# Patient Record
Sex: Male | Born: 1990 | Hispanic: No | Marital: Single | Smoking: Current every day smoker
Health system: Southern US, Community
[De-identification: ages and names within clinical notes are randomized; demographics above are authoritative.]

## PROBLEM LIST (undated history)

## (undated) ENCOUNTER — Ambulatory Visit

## (undated) HISTORY — PX: TONSILLECTOMY: SUR1361

---
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
## 2011-05-09 ENCOUNTER — Encounter (HOSPITAL_COMMUNITY): Payer: Self-pay | Admitting: *Deleted

## 2011-05-09 ENCOUNTER — Emergency Department (HOSPITAL_COMMUNITY)
Admission: EM | Admit: 2011-05-09 | Discharge: 2011-05-09 | Disposition: A | Discharge status: Home / Self Care | Payer: BC Managed Care – PPO | Source: Home / Self Care | Attending: Family Medicine | Admitting: Family Medicine

## 2011-05-09 DIAGNOSIS — Z23 Encounter for immunization: Secondary | ICD-10-CM

## 2011-05-09 DIAGNOSIS — S91309A Unspecified open wound, unspecified foot, initial encounter: Secondary | ICD-10-CM

## 2011-05-09 DIAGNOSIS — S91331A Puncture wound without foreign body, right foot, initial encounter: Secondary | ICD-10-CM

## 2011-05-09 MED ORDER — TETANUS-DIPHTH-ACELL PERTUSSIS 5-2.5-18.5 LF-MCG/0.5 IM SUSP
0.5000 mL | Freq: Once | INTRAMUSCULAR | Status: AC
  Administered 2011-05-09: 0.5 mL via INTRAMUSCULAR

## 2011-05-09 MED ORDER — TETANUS-DIPHTH-ACELL PERTUSSIS 5-2.5-18.5 LF-MCG/0.5 IM SUSP
INTRAMUSCULAR | Status: AC
  Filled 2011-05-09: qty 0.5

## 2011-05-09 NOTE — ED Provider Notes (Signed)
Medical screening examination/treatment/procedure(s) were performed by a resident physician and as supervising physician I was immediately available for consultation/collaboration.  Leslee Home, M.D.   Reuben Likes, MD 05/09/11 2141

## 2011-05-09 NOTE — Discharge Instructions (Signed)
Thank you for coming in today. I think you will be fine.  If your foot becomes red and very painful or starts to ooze pus please come right back or go to your doctor.  This can get infected with some nasty bacteria however they often are fine.

## 2011-05-09 NOTE — ED Provider Notes (Signed)
Casey Gallagher is a 21 y.o. male who presents to Urgent Care today for puncture wound on the heel of the right foot.  At 10:30 this morning the patient was walking in his backyard when he stepped on a rusty nail.  The nail went through his shoe and into the heel of his right foot.  He immediately stepped off the nail removed issue.  He then cleaned his foot and went to urgent care.  He feels well and doesn't have any significant pain of his right foot.  He thinks his last tetanus shot was 5 or 10 years ago but is not sure.   PMH reviewed. Otherwise healthy young man ROS as above otherwise neg.  no chest pains, palpitations, fevers, chills, abdominal pain nausea or vomiting. Medications reviewed. Current Facility-Administered Medications  Medication Dose Route Frequency Provider Last Rate Last Dose  . TDaP (BOOSTRIX) injection 0.5 mL  0.5 mL Intramuscular Once Adlih Moreno-Coll, MD   0.5 mL at 05/09/11 1401   Current Outpatient Prescriptions  Medication Sig Dispense Refill  . ALBUTEROL IN Inhale into the lungs as needed.      . tiotropium (SPIRIVA) 18 MCG inhalation capsule Place 18 mcg into inhaler and inhale daily.      . TRAMADOL HCL PO Take by mouth as needed.        Exam:  BP 110/68  Pulse 54  Temp(Src) 97.6 F (36.4 C) (Oral)  Resp 16  SpO2 100% Gen: Well NAD RIGHT FOOT: Very small puncture wound on the heel of the right foot.  No surrounding erythema or bleeding.  No apparent foreign body.  Wound is nontender to palpation.  No results found for this or any previous visit (from the past 24 hour(s)). No results found.  Assessment and Plan: 21 y.o. male with puncture wound on the right foot. The wound is very superficial and noninfected.  The wound was cleaned well in clinic and he was administered a tetanus shot.  I don't think the risk for infection is high enough to empirically start prophylactic antibiotics.  I discussed the plan with the patient. Plan to do watchful waiting and  followup with her primary care doctor in a week.  Discussed signs or symptoms of infection such as redness pain and pus. If he experiences those symptoms he will go right to the doctor's office or the emergency room.  He expresses understanding.    Rodolph Bong, MD 05/09/11 1410

## 2011-05-09 NOTE — ED Notes (Signed)
Pt soaking rt foot in soln of warm water with CHG detergent.

## 2011-05-09 NOTE — ED Notes (Addendum)
Reports stepping on nail early this AM; nail punctured shoe sole and right heel.  Heel soaking.  Unsure date of last Tdap.

## 2012-08-20 ENCOUNTER — Emergency Department (HOSPITAL_COMMUNITY)
Admission: EM | Admit: 2012-08-20 | Discharge: 2012-08-20 | Disposition: A | Discharge status: Home / Self Care | Payer: BC Managed Care – PPO | Source: Home / Self Care | Attending: Family Medicine | Admitting: Family Medicine

## 2012-08-20 ENCOUNTER — Encounter (HOSPITAL_COMMUNITY): Payer: Self-pay | Admitting: *Deleted

## 2012-08-20 ENCOUNTER — Emergency Department (INDEPENDENT_AMBULATORY_CARE_PROVIDER_SITE_OTHER): Payer: BC Managed Care – PPO

## 2012-08-20 DIAGNOSIS — S90129A Contusion of unspecified lesser toe(s) without damage to nail, initial encounter: Secondary | ICD-10-CM

## 2012-08-20 DIAGNOSIS — S99929A Unspecified injury of unspecified foot, initial encounter: Secondary | ICD-10-CM

## 2012-08-20 DIAGNOSIS — S99919A Unspecified injury of unspecified ankle, initial encounter: Secondary | ICD-10-CM

## 2012-08-20 DIAGNOSIS — S90121A Contusion of right lesser toe(s) without damage to nail, initial encounter: Secondary | ICD-10-CM

## 2012-08-20 DIAGNOSIS — S99921A Unspecified injury of right foot, initial encounter: Secondary | ICD-10-CM

## 2012-08-20 NOTE — ED Notes (Signed)
Reports playing around with sibling yesterday afternoon when he stepped onto right great toe while bent.  C/O continued pain.  Ecchymosis noted.

## 2012-08-20 NOTE — ED Provider Notes (Signed)
CSN: [id number]     Arrival date & time 08/20/12  1122 History     First MD Initiated Contact with Patient 08/20/12 1135     Chief Complaint  Patient presents with  . Toe Injury   (Consider location/radiation/quality/duration/timing/severity/associated sxs/prior Treatment) HPI Comments: 22 year old male presents for evaluation of injury to his right toe. He was "roughhousing" with his brother when his brother landed on his toe and hurt it. Now, he has swelling and bruising in the right great toe and has difficulty moving the joint. The swelling and pain have increased from yesterday to today. The top of the toe is very tender to touch as well. He has a history of breaking a finger with minimal pain so he just wanted to be careful. Denies any numbness distal to the injury or history of frequent broken bones. No history of previous toe injury   Past Medical History  Diagnosis Date  . Emphysema   . Asthma    Past Surgical History  Procedure Laterality Date  . Tonsillectomy     No family history on file. History  Substance Use Topics  . Smoking status: Current Every Day Smoker -- 0.50 packs/day for 13 years    Types: Cigarettes  . Smokeless tobacco: Not on file  . Alcohol Use: No    Review of Systems  Constitutional: Negative for fever, chills and fatigue.  Eyes: Negative for visual disturbance.  Respiratory: Negative for cough and shortness of breath.   Cardiovascular: Negative for chest pain, palpitations and leg swelling.  Musculoskeletal: Positive for joint swelling (right great toe) and arthralgias. Negative for myalgias.  Skin: Negative for rash and wound.  Neurological: Negative for dizziness, weakness and light-headedness.    Allergies  Review of patient's allergies indicates no known allergies.  Home Medications   Current Outpatient Rx  Name  Route  Sig  Dispense  Refill  . ALBUTEROL IN   Inhalation   Inhale into the lungs as needed.         . tiotropium  (SPIRIVA) 18 MCG inhalation capsule   Inhalation   Place 18 mcg into inhaler and inhale daily.         . TRAMADOL HCL PO   Oral   Take by mouth as needed.          BP 122/80  Pulse 71  Temp(Src) 98.4 F (36.9 C) (Oral)  Resp 16  SpO2 100% Physical Exam  Nursing note and vitals reviewed. Constitutional: He is oriented to person, place, and time. He appears well-developed and well-nourished. No distress.  Musculoskeletal:       Feet:  Neurological: He is alert and oriented to person, place, and time. Coordination normal.  Skin: Skin is warm and dry. No rash noted. He is not diaphoretic.  Psychiatric: He has a normal mood and affect.    ED Course   Procedures (including critical care time)  Labs Reviewed - No data to display Dg Toe Great Right  08/20/2012   *RADIOLOGY REPORT*  Clinical Data: Pain post trauma.  RIGHT GREAT TOE  Comparison: None.  Findings: No radiodense foreign body or subcutaneous gas. Negative for fracture, dislocation, or other acute abnormality.  Normal alignment and mineralization. No significant degenerative change. Regional soft tissues unremarkable.  IMPRESSION:  Negative   Original Report Authenticated By: D. Andria Rhein, MD   1. Toe injury, right, initial encounter   2. Contusion, toe, right, initial encounter     MDM  No radiographic evidence of  fracture. RICE, PRN ibuprofen or tylenol, followup if worsening.  Graylon Good, PA-C 08/20/12 1235

## 2012-08-21 NOTE — ED Provider Notes (Signed)
Medical screening examination/treatment/procedure(s) were performed by non-physician practitioner and as supervising physician I was immediately available for consultation/collaboration.   [facility]; MD  Sharin Grave, MD 08/21/12 1006

## 2014-12-11 ENCOUNTER — Encounter (HOSPITAL_COMMUNITY): Payer: Self-pay | Admitting: Emergency Medicine

## 2014-12-11 ENCOUNTER — Emergency Department (HOSPITAL_COMMUNITY)
Admission: EM | Admit: 2014-12-11 | Discharge: 2014-12-11 | Disposition: A | Discharge status: Home / Self Care | Payer: 59 | Source: Home / Self Care | Attending: Emergency Medicine | Admitting: Emergency Medicine

## 2014-12-11 DIAGNOSIS — J329 Chronic sinusitis, unspecified: Secondary | ICD-10-CM

## 2014-12-11 MED ORDER — PREDNISONE 50 MG PO TABS: ORAL_TABLET | ORAL | Status: DC

## 2014-12-11 MED ORDER — AZITHROMYCIN 250 MG PO TABS: ORAL_TABLET | ORAL | Status: DC

## 2014-12-11 MED ORDER — IPRATROPIUM BROMIDE 0.06 % NA SOLN: 2.0000 | Freq: Four times a day (QID) | NASAL | Status: DC

## 2014-12-11 NOTE — ED Notes (Signed)
Pt here with URI sx's after son was sick last week C/o nasal congestion,headache. Post nasal drip, cough and dk brown phlegm Taking cough syrup OTC, not working  Sx's started Sunday with worsening yesterday  VSS, Hx Asthma, Emphysema

## 2014-12-11 NOTE — ED Provider Notes (Signed)
CSN: [id number]     Arrival date & time 12/11/14  1303 History   First MD Initiated Contact with Patient 12/11/14 1334     Chief Complaint  Patient presents with  . URI  . Sinus Problem   (Consider location/radiation/quality/duration/timing/severity/associated sxs/prior Treatment) HPI  He is a 24 year old man here for evaluation of nasal congestion and rhinorrhea. He states his son was sick with similar symptoms last week. On Sunday, he noted some mild nasal congestion, cough, and rhinorrhea. His symptoms worsened on Monday. He reports headaches, sinus pressure, right earache, nasal congestion, rhinorrhea, postnasal drainage, and cough. The cough is productive of dark brown sputum. He states he will feel a little short of breath at night. He states he has felt warm, but has not taken a temperature. He denies body aches. He reports decreased energy. He does have a history of asthma and emphysema. He has Spiriva and albuterol inhalers, but has not been using them the last few days due to moving.  Past Medical History  Diagnosis Date  . Emphysema   . Asthma    Past Surgical History  Procedure Laterality Date  . Tonsillectomy     No family history on file. Social History  Substance Use Topics  . Smoking status: Current Every Day Smoker -- 0.50 packs/day for 13 years    Types: Cigarettes  . Smokeless tobacco: None  . Alcohol Use: No    Review of Systems As in history of present illness Allergies  Review of patient's allergies indicates no known allergies.  Home Medications   Prior to Admission medications   Medication Sig Start Date End Date Taking? Authorizing Provider  ALBUTEROL IN Inhale into the lungs as needed.    Historical Provider, MD  azithromycin (ZITHROMAX Z-PAK) 250 MG tablet Take 2 pills today, then 1 pill daily until gone. 12/11/14   Charm Rings, MD  ipratropium (ATROVENT) 0.06 % nasal spray Place 2 sprays into both nostrils 4 (four) times daily. 12/11/14   Charm Rings, MD  predniSONE (DELTASONE) 50 MG tablet Take 1 pill daily for 5 days. 12/11/14   Charm Rings, MD  tiotropium (SPIRIVA) 18 MCG inhalation capsule Place 18 mcg into inhaler and inhale daily.    Historical Provider, MD  TRAMADOL HCL PO Take by mouth as needed.    Historical Provider, MD   Meds Ordered and Administered this Visit  Medications - No data to display  BP 110/62 mmHg  Pulse 79  Temp(Src) 98.6 F (37 C) (Oral)  SpO2 100% No data found.   Physical Exam  Constitutional: He is oriented to person, place, and time. He appears well-developed and well-nourished.  HENT:  Mouth/Throat: No oropharyngeal exudate.  TMs mildly retracted bilaterally. Nasal mucosa is erythematous and swollen. Oropharynx is erythematous, but without exudate. He does have significant postnasal drainage.  Eyes: Conjunctivae are normal.  Neck: Neck supple.  Cardiovascular: Normal rate, regular rhythm and normal heart sounds.   No murmur heard. Pulmonary/Chest: Effort normal and breath sounds normal. No respiratory distress. He has no wheezes. He has no rales.  Lymphadenopathy:    He has cervical adenopathy.  Neurological: He is alert and oriented to person, place, and time.    ED Course  Procedures (including critical care time)  Labs Review Labs Reviewed - No data to display  Imaging Review No results found.     MDM   1. Rhinosinusitis    Treat symptomatically with prednisone and Atrovent nasal spray. Discussed importance  of resuming his inhalers as soon as possible. Prescription given for azithromycin to be filled on Friday if not improving. Follow-up as needed.    Charm RingsErin J Honig, MD 12/11/14 [phone]

## 2014-12-11 NOTE — Discharge Instructions (Signed)
You have a sinus infection. Likely viral at this point. Please take prednisone daily for the next 5 days. Use the Atrovent nasal spray 4 times a day as needed for congestion and runny nose. If you are not getting better by Friday, fill the prescription for azithromycin. Continue your Spiriva and albuterol as prescribed. Follow-up as needed.

## 2015-01-08 ENCOUNTER — Encounter (HOSPITAL_COMMUNITY): Payer: Self-pay | Admitting: Emergency Medicine

## 2015-01-08 ENCOUNTER — Emergency Department (INDEPENDENT_AMBULATORY_CARE_PROVIDER_SITE_OTHER)
Admission: EM | Admit: 2015-01-08 | Discharge: 2015-01-08 | Disposition: A | Discharge status: Home / Self Care | Payer: 59 | Source: Home / Self Care | Attending: Emergency Medicine | Admitting: Emergency Medicine

## 2015-01-08 DIAGNOSIS — H9201 Otalgia, right ear: Secondary | ICD-10-CM | POA: Diagnosis not present

## 2015-01-08 DIAGNOSIS — H6691 Otitis media, unspecified, right ear: Secondary | ICD-10-CM

## 2015-01-08 MED ORDER — IBUPROFEN 800 MG PO TABS: 800.0000 mg | ORAL_TABLET | Freq: Three times a day (TID) | ORAL | Status: DC

## 2015-01-08 MED ORDER — AMOXICILLIN-POT CLAVULANATE 875-125 MG PO TABS: 1.0000 | ORAL_TABLET | Freq: Two times a day (BID) | ORAL | Status: DC

## 2015-01-08 NOTE — Discharge Instructions (Signed)
Follow-up with your primary care physician. Go to the ER for the signs and symptoms we discussed. Try looking for an over-the-counter pain ear drop called Auralgan or A/B otic. Ask the pharmacist if they have this available. Many people find this helpful for ear pain.  Continue nasal steroid, start some saline nasal irrigation as needed for congestion.

## 2015-01-08 NOTE — ED Notes (Signed)
Here with c/o right ear pain, sharp, shooting pain that started 3 dys ago S/o Sinus infection, no dizziness or headaches reported

## 2015-01-08 NOTE — ED Provider Notes (Signed)
HPI  SUBJECTIVE:  Casey Gallagher is a 24 y.o. male who presents with 4 days of sharp, shooting pain in his right ear, muffled hearing. He reports right-sided headache, sore throat, nasal congestion, postnasal drip. He tried earwax remover without much improvement. There are no other aggravating or alleviating factors. He denies sinus pain/pressure, facial rash. No nausea, vomiting, fevers. No other URI-like symptoms. he reports popping and clicking his jaw, which is normal. 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 He grinds his teeth at night. His ear pain is not associated with chewing, eating, yawning. He does have sick contacts with similar symptoms. No antipyretic in the past 4-6 hours. He was recently treated for sinusitis 2 weeks ago with azithromycin, nasal steroids, oral steroid. Past medical history of TMJ disorder, asthma/emphysema, low back pain. No history of diabetes, hypertension, allergies.    Past Medical History  Diagnosis Date  . Emphysema   . Asthma     Past Surgical History  Procedure Laterality Date  . Tonsillectomy      No family history on file.  Social History  Substance Use Topics  . Smoking status: Current Every Day Smoker -- 0.50 packs/day for 13 years    Types: Cigarettes  . Smokeless tobacco: None  . Alcohol Use: No    No current facility-administered medications for this encounter.  Current outpatient prescriptions:  .  ALBUTEROL IN, Inhale into the lungs as needed., Disp: , Rfl:  .  amoxicillin-clavulanate (AUGMENTIN) 875-125 MG tablet, Take 1 tablet by mouth 2 (two) times daily. X 10 days, Disp: 20 tablet, Rfl: 0 .  ibuprofen (ADVIL,MOTRIN) 800 MG tablet, Take 1 tablet (800 mg total) by mouth 3 (three) times daily., Disp: 30 tablet, Rfl: 0 .  ipratropium (ATROVENT) 0.06 % nasal spray, Place 2 sprays into both nostrils 4 (four) times daily., Disp: 15 mL, Rfl: 0 .  tiotropium (SPIRIVA) 18 MCG inhalation capsule, Place 18 mcg into inhaler and  inhale daily., Disp: , Rfl:  .  TRAMADOL HCL PO, Take by mouth as needed., Disp: , Rfl:   No Known Allergies   ROS  As noted in HPI.   Physical Exam  BP 118/56 mmHg  Pulse 54  Temp(Src) 97.7 F (36.5 C) (Oral)  SpO2 96%  Constitutional: Well developed, well nourished, no acute distress Eyes:  EOMI, conjunctiva normal bilaterally HENT: Normocephalic, atraumatic,mucus membranes moist. Right external ear within normal limits. Right external ear canal normal. Right TM injected, dull, but not bulging. No tenderness, crepitus at the bilateral TMJ. No trismus. Oropharynx normal. Hearing decreased right ear, but intact. Respiratory: Normal inspiratory effort Cardiovascular: Normal rate GI: nondistended skin: No rash, skin intact Musculoskeletal: no deformities Neurologic: Alert & oriented x 3, no focal neuro deficits Psychiatric: Speech and behavior appropriate   ED Course   Medications - No data to display  No orders of the defined types were placed in this encounter.    No results found for this or any previous visit (from the past 24 hour(s)). No results found.  ED Clinical Impression  Otalgia of right ear  Acute right otitis media, recurrence not specified, unspecified otitis media type   ED Assessment/Plan  Patient with right otitis media. Given that he just finished a course of azithromycin, will start him on Augmentin. Also 800 mg ibuprofen 3 times a day when necessary pain. Combined this with Tylenol. A/B otic or Auralgan over-the-counter for additional ear pain. Does not appear to be any TMJ involvement. Hearing is decreased,  but intact. Follow-up with primary care physician in several days, discussed signs and symptoms that should prompt return to the ER. Patient agrees with plan.   *This clinic note was created using Dragon dictation software. Therefore, there may be occasional mistakes despite careful proofreading.  ?   Domenick GongAshley Malaysha Arlen, MD 01/08/15  1743

## 2015-08-02 ENCOUNTER — Encounter (HOSPITAL_COMMUNITY): Payer: Self-pay | Admitting: *Deleted

## 2015-08-02 ENCOUNTER — Ambulatory Visit (HOSPITAL_COMMUNITY)
Admission: EM | Admit: 2015-08-02 | Discharge: 2015-08-02 | Disposition: A | Discharge status: Home / Self Care | Payer: Medicaid Other | Attending: Family Medicine | Admitting: Family Medicine

## 2015-08-02 DIAGNOSIS — A0811 Acute gastroenteropathy due to Norwalk agent: Secondary | ICD-10-CM

## 2015-08-02 MED ORDER — SODIUM CHLORIDE 0.9 % IV BOLUS (SEPSIS)
1000.0000 mL | Freq: Once | INTRAVENOUS | Status: AC
  Administered 2015-08-02: 1000 mL via INTRAVENOUS

## 2015-08-02 MED ORDER — ONDANSETRON HCL 4 MG/2ML IJ SOLN
4.0000 mg | Freq: Once | INTRAMUSCULAR | Status: AC
  Administered 2015-08-02: 4 mg via INTRAVENOUS

## 2015-08-02 MED ORDER — ONDANSETRON HCL 4 MG/2ML IJ SOLN
INTRAMUSCULAR | Status: AC
  Filled 2015-08-02: qty 2

## 2015-08-02 MED ORDER — DIPHENOXYLATE-ATROPINE 2.5-0.025 MG PO TABS: 2.0000 | ORAL_TABLET | Freq: Four times a day (QID) | ORAL | Status: DC | PRN

## 2015-08-02 NOTE — ED Notes (Signed)
Pt  Reports  Symptoms  Of  Diarrhea           X  1  Week     With  Some weakness   Some  Nausea  Present    However  No diarrhea        Pt reports        Some back        As  Well

## 2015-08-02 NOTE — ED Provider Notes (Signed)
CSN: [id number]     Arrival date & time 08/02/15  1019 History   First MD Initiated Contact with Patient 08/02/15 1112     Chief Complaint  Patient presents with  . Diarrhea   (Consider location/radiation/quality/duration/timing/severity/associated sxs/prior Treatment) Patient is a 25 y.o. male presenting with diarrhea. The history is provided by the patient.  Diarrhea Quality:  Watery Severity:  Moderate Onset quality:  Sudden Number of episodes:  5 Duration:  1 week Timing:  Constant Progression:  Unchanged Worsened by:  Nothing tried Ineffective treatments:  None tried Associated symptoms: no abdominal pain, no fever and no vomiting   Associated symptoms comment:  No blood in stool  Risk factors: no sick contacts, no suspicious food intake and no travel to endemic areas     Past Medical History  Diagnosis Date  . Emphysema   . Asthma    Past Surgical History  Procedure Laterality Date  . Tonsillectomy     History reviewed. No pertinent family history. Social History  Substance Use Topics  . Smoking status: Current Every Day Smoker -- 0.50 packs/day for 13 years    Types: Cigarettes  . Smokeless tobacco: None  . Alcohol Use: No    Review of Systems  Constitutional: Positive for appetite change. Negative for fever.  HENT: Negative.   Gastrointestinal: Positive for nausea and diarrhea. Negative for vomiting, abdominal pain and blood in stool.  Genitourinary: Negative for dysuria, urgency and flank pain.    Allergies  Review of patient's allergies indicates no known allergies.  Home Medications   Prior to Admission medications   Medication Sig Start Date End Date Taking? Authorizing Provider  ALBUTEROL IN Inhale into the lungs as needed.    Historical Provider, MD  amoxicillin-clavulanate (AUGMENTIN) 875-125 MG tablet Take 1 tablet by mouth 2 (two) times daily. X 10 days 01/08/15   Domenick Gong, MD  diphenoxylate-atropine (LOMOTIL) 2.5-0.025 MG tablet  Take 2 tablets by mouth 4 (four) times daily as needed for diarrhea or loose stools. 08/02/15   Linna Hoff, MD  ibuprofen (ADVIL,MOTRIN) 800 MG tablet Take 1 tablet (800 mg total) by mouth 3 (three) times daily. 01/08/15   Domenick Gong, MD  ipratropium (ATROVENT) 0.06 % nasal spray Place 2 sprays into both nostrils 4 (four) times daily. 12/11/14   Charm Rings, MD  tiotropium (SPIRIVA) 18 MCG inhalation capsule Place 18 mcg into inhaler and inhale daily.    Historical Provider, MD  TRAMADOL HCL PO Take by mouth as needed.    Historical Provider, MD   Meds Ordered and Administered this Visit   Medications  ondansetron (ZOFRAN) injection 4 mg (4 mg Intravenous Given 08/02/15 1149)  sodium chloride 0.9 % bolus 1,000 mL (1,000 mLs Intravenous Given 08/02/15 1149)    BP 110/70 mmHg  Pulse 78  Temp(Src) 98.6 F (37 C)  Resp 18  SpO2 100% No data found.   Physical Exam  Constitutional: He is oriented to person, place, and time. He appears well-developed and well-nourished. No distress.  HENT:  Mouth/Throat: Oropharynx is clear and moist.  Eyes: Conjunctivae are normal. Pupils are equal, round, and reactive to light.  Neck: Normal range of motion. Neck supple.  Cardiovascular: Normal rate, regular rhythm, normal heart sounds and intact distal pulses.   Pulmonary/Chest: Effort normal and breath sounds normal.  Abdominal: Soft. Bowel sounds are normal. He exhibits no distension and no mass. There is no tenderness. There is no rebound and no guarding.  Lymphadenopathy:  He has no cervical adenopathy.  Neurological: He is alert and oriented to person, place, and time.  Skin: Skin is warm and dry.  Nursing note and vitals reviewed.   ED Course  Procedures (including critical care time)  Labs Review Labs Reviewed - No data to display  Imaging Review No results found.   Visual Acuity Review  Right Eye Distance:   Left Eye Distance:   Bilateral Distance:    Right Eye  Near:   Left Eye Near:    Bilateral Near:         MDM   1. Gastroenteritis due to norovirus    Sx improved after ivf at time of d/c.    Linna HoffJames D Kindl, MD 08/02/15 1229

## 2015-11-26 ENCOUNTER — Ambulatory Visit (HOSPITAL_COMMUNITY)
Admission: EM | Admit: 2015-11-26 | Discharge: 2015-11-26 | Disposition: A | Discharge status: Home / Self Care | Payer: Medicaid Other | Attending: Family Medicine | Admitting: Family Medicine

## 2015-11-26 ENCOUNTER — Encounter (HOSPITAL_COMMUNITY): Payer: Self-pay | Admitting: Family Medicine

## 2015-11-26 DIAGNOSIS — J0140 Acute pansinusitis, unspecified: Secondary | ICD-10-CM

## 2015-11-26 MED ORDER — AMOXICILLIN-POT CLAVULANATE 875-125 MG PO TABS: 1.0000 | ORAL_TABLET | Freq: Two times a day (BID) | ORAL | 0 refills | Status: AC

## 2015-11-26 MED ORDER — BENZONATATE 100 MG PO CAPS: 100.0000 mg | ORAL_CAPSULE | Freq: Three times a day (TID) | ORAL | 0 refills | Status: DC | PRN

## 2015-11-26 NOTE — ED Provider Notes (Signed)
CSN: [id number]     Arrival date & time 11/26/15  1148 History   First MD Initiated Contact with Patient 11/26/15 1223     Chief Complaint  Patient presents with  . Cough  . Nasal Congestion  . Dizziness  . Nausea   (Consider location/radiation/quality/duration/timing/severity/associated sxs/prior Treatment) 25 year old male presents with nasal congestion, sinus pressure, sore throat, cough, dizziness and nausea for over 1 week. Vomited last night due to mucus and dizziness. Denies any diarrhea. Has not taken any medication for symptoms. Has history of asthma- has Spiriva and Albuterol but has not used either inhaler for this illness. Both children are also sick at home with milder symptoms.       Past Medical History:  Diagnosis Date  . Asthma   . Emphysema    Past Surgical History:  Procedure Laterality Date  . TONSILLECTOMY     History reviewed. No pertinent family history. Social History  Substance Use Topics  . Smoking status: Current Every Day Smoker    Packs/day: 0.50    Years: 13.00    Types: Cigarettes  . Smokeless tobacco: Never Used  . Alcohol use No    Review of Systems  Constitutional: Positive for appetite change, chills and fatigue. Negative for fever.  HENT: Positive for congestion, sinus pain, sinus pressure and sore throat. Negative for ear discharge and ear pain.   Eyes: Negative for discharge.  Respiratory: Positive for cough and shortness of breath. Negative for chest tightness and wheezing.   Cardiovascular: Negative for chest pain.  Gastrointestinal: Positive for nausea and vomiting. Negative for abdominal pain, constipation and diarrhea.  Genitourinary: Negative for difficulty urinating.  Musculoskeletal: Negative for joint swelling, neck pain and neck stiffness.  Skin: Negative for rash and wound.  Neurological: Positive for dizziness, light-headedness and headaches. Negative for syncope, weakness and numbness.  Hematological: Negative for  adenopathy.    Allergies  Patient has no known allergies.  Home Medications   Prior to Admission medications   Medication Sig Start Date End Date Taking? Authorizing Provider  ALBUTEROL IN Inhale into the lungs as needed.    Historical Provider, MD  amoxicillin-clavulanate (AUGMENTIN) 875-125 MG tablet Take 1 tablet by mouth every 12 (twelve) hours. 11/26/15 12/03/15  Sudie GrumblingAnn Berry Tramaine Sauls, NP  benzonatate (TESSALON) 100 MG capsule Take 1 capsule (100 mg total) by mouth 3 (three) times daily as needed for cough. 11/26/15   Sudie GrumblingAnn Berry Yanelie Abraha, NP  tiotropium (SPIRIVA) 18 MCG inhalation capsule Place 18 mcg into inhaler and inhale daily.    Historical Provider, MD   Meds Ordered and Administered this Visit  Medications - No data to display  BP 137/82   Pulse 61   Temp 97.9 F (36.6 C)   Resp 16   SpO2 100%  No data found.   Physical Exam  Constitutional: He is oriented to person, place, and time. He appears well-developed and well-nourished. No distress.  HENT:  Head: Normocephalic and atraumatic.  Right Ear: Hearing, external ear and ear canal normal. Tympanic membrane is bulging. Tympanic membrane is not injected and not erythematous.  Left Ear: Hearing, tympanic membrane, external ear and ear canal normal.  Nose: Mucosal edema and rhinorrhea present. Right sinus exhibits maxillary sinus tenderness and frontal sinus tenderness. Left sinus exhibits maxillary sinus tenderness and frontal sinus tenderness.  Mouth/Throat: Uvula is midline and mucous membranes are normal. Posterior oropharyngeal erythema present.  Neck: Normal range of motion. Neck supple.  Cardiovascular: Normal rate, regular rhythm and normal  heart sounds.   Pulmonary/Chest: Effort normal. He has decreased breath sounds in the right upper field, the right lower field, the left upper field and the left lower field. He has no wheezes. He has no rhonchi.  Lymphadenopathy:    He has cervical adenopathy.       Right cervical:  Superficial cervical adenopathy present.       Left cervical: Superficial cervical adenopathy present.  Neurological: He is alert and oriented to person, place, and time.  Skin: Skin is warm and dry. Capillary refill takes less than 2 seconds.  Psychiatric: He has a normal mood and affect. His behavior is normal. Judgment and thought content normal.    Urgent Care Course   Clinical Course     Procedures (including critical care time)  Labs Review Labs Reviewed - No data to display  Imaging Review No results found.   Visual Acuity Review  Right Eye Distance:   Left Eye Distance:   Bilateral Distance:    Right Eye Near:   Left Eye Near:    Bilateral Near:         MDM   1. Acute non-recurrent pansinusitis    Recommend start Augmentin 875mg  twice a day as directed. May take Benzonatate cough pills every 8 hours as needed for cough. Increase fluid intake. Recommend use Albuterol 2 puffs every 6 hours as needed (has medication at home) Encouraged to d/c smoking. Follow-up with a primary care provider in 3 to 4 days if not improving.      Sudie GrumblingAnn Berry Graciano Batson, NP 11/26/15 [phone]

## 2015-11-26 NOTE — Discharge Instructions (Signed)
Start Augmentin twice a day as directed. May take Benzonatate cough pills every 8 hours as needed for cough. Increase fluid intake. Follow-up with your primary care provider in 3 to 4 days if not improving.

## 2015-11-26 NOTE — ED Triage Notes (Signed)
Pt here for cough, congestion, nasal congestion, dizziness and nausea. sts he feels like his equilibrium is off.

## 2016-01-31 ENCOUNTER — Encounter (HOSPITAL_COMMUNITY): Payer: Self-pay | Admitting: *Deleted

## 2016-01-31 ENCOUNTER — Ambulatory Visit (HOSPITAL_COMMUNITY)
Admission: EM | Admit: 2016-01-31 | Discharge: 2016-01-31 | Disposition: A | Discharge status: Home / Self Care | Payer: No Typology Code available for payment source | Attending: Family Medicine | Admitting: Family Medicine

## 2016-01-31 DIAGNOSIS — M62830 Muscle spasm of back: Secondary | ICD-10-CM | POA: Diagnosis not present

## 2016-01-31 MED ORDER — METHOCARBAMOL 500 MG PO TABS: 500.0000 mg | ORAL_TABLET | Freq: Two times a day (BID) | ORAL | 0 refills | Status: DC

## 2016-01-31 MED ORDER — DICLOFENAC SODIUM 75 MG PO TBEC: 75.0000 mg | DELAYED_RELEASE_TABLET | Freq: Two times a day (BID) | ORAL | 0 refills | Status: DC

## 2016-01-31 NOTE — ED Provider Notes (Signed)
CSN: [id number]     Arrival date & time 01/31/16  1435 History   None    Chief Complaint  Patient presents with  . Back Pain   (Consider location/radiation/quality/duration/timing/severity/associated sxs/prior Treatment) 26 year old male presents with chief complaint of lower back pain. He denies any history of trauma or of fall. He states he was shoveling snow out of his driveway yesterday and woke up with pain today. He has had no loss of sensation in his extremities, no fever, no loss of control of bowel or bladder. No head ache, no other red flag signs.   The history is provided by the patient.  Back Pain    Past Medical History:  Diagnosis Date  . Asthma   . Emphysema    Past Surgical History:  Procedure Laterality Date  . TONSILLECTOMY     History reviewed. No pertinent family history. Social History  Substance Use Topics  . Smoking status: Current Every Day Smoker    Packs/day: 0.50    Years: 13.00    Types: Cigarettes  . Smokeless tobacco: Never Used  . Alcohol use No    Review of Systems  Reason unable to perform ROS: as covered in HPI.  Musculoskeletal: Positive for back pain.  All other systems reviewed and are negative.   Allergies  Patient has no known allergies.  Home Medications   Prior to Admission medications   Medication Sig Start Date End Date Taking? Authorizing Provider  ALBUTEROL IN Inhale into the lungs as needed.    Historical Provider, MD  benzonatate (TESSALON) 100 MG capsule Take 1 capsule (100 mg total) by mouth 3 (three) times daily as needed for cough. 11/26/15   Sudie GrumblingAnn Berry Amyot, NP  diclofenac (VOLTAREN) 75 MG EC tablet Take 1 tablet (75 mg total) by mouth 2 (two) times daily. 01/31/16   Dorena BodoLawrence Amaury Kuzel, NP  methocarbamol (ROBAXIN) 500 MG tablet Take 1 tablet (500 mg total) by mouth 2 (two) times daily. 01/31/16   Dorena BodoLawrence Ryna Beckstrom, NP  tiotropium (SPIRIVA) 18 MCG inhalation capsule Place 18 mcg into inhaler and inhale daily.     Historical Provider, MD   Meds Ordered and Administered this Visit  Medications - No data to display  BP 130/80 (BP Location: Right Arm)   Pulse 78   Temp 98.6 F (37 C)   Resp 18   SpO2 100%  No data found.   Physical Exam  Constitutional: He is oriented to person, place, and time. He appears well-developed and well-nourished.  HENT:  Head: Normocephalic and atraumatic.  Eyes: Conjunctivae are normal.  Cardiovascular:  No murmur heard. Abdominal: There is no tenderness.  Musculoskeletal: He exhibits no edema or deformity.       Thoracic back: He exhibits tenderness, pain and spasm. He exhibits no bony tenderness, no swelling, no edema and no deformity.       Back:  Neurological: He is alert and oriented to person, place, and time.  Skin: Skin is warm and dry.  Psychiatric: He has a normal mood and affect.  Nursing note and vitals reviewed.   Urgent Care Course     Procedures (including critical care time)  Labs Review Labs Reviewed - No data to display  Imaging Review No results found.   Visual Acuity Review  Right Eye Distance:   Left Eye Distance:   Bilateral Distance:    Right Eye Near:   Left Eye Near:    Bilateral Near:  MDM   1. Muscle spasm of back   You have muscle strain of your back. I have prescribed two medicines for pain. Diclfenac, take 1 tablet twice a day and Robaxin, take 1 tablet twice a day. The robaxin may cause drowsiness, do not drink alcohol or drive while taking this medicine. I also recommend heat alternating with ice, and rest. Should your symptoms fail to improve or worsen follow up with your primary care provider or return to clinic.      Dorena Bodo, NP 01/31/16 1726

## 2016-01-31 NOTE — ED Triage Notes (Signed)
Pt   Noticed  Back  Pain  Yesterday      After   Shoveling     Snow         And  Lifting

## 2016-01-31 NOTE — Discharge Instructions (Signed)
You have muscle strain of your back. I have prescribed two medicines for pain. Diclfenac, take 1 tablet twice a day and Robaxin, take 1 tablet twice a day. The robaxin may cause drowsiness, do not drink alcohol or drive while taking this medicine. I also recommend heat alternating with ice, and rest. Should your symptoms fail to improve or worsen follow up with your primary care provider or return to clinic.

## 2016-05-27 ENCOUNTER — Encounter (HOSPITAL_COMMUNITY): Payer: Self-pay | Admitting: Emergency Medicine

## 2016-05-27 ENCOUNTER — Ambulatory Visit (HOSPITAL_COMMUNITY)
Admission: EM | Admit: 2016-05-27 | Discharge: 2016-05-27 | Disposition: A | Discharge status: Home / Self Care | Payer: No Typology Code available for payment source | Attending: Family Medicine | Admitting: Family Medicine

## 2016-05-27 DIAGNOSIS — R110 Nausea: Secondary | ICD-10-CM

## 2016-05-27 DIAGNOSIS — K2900 Acute gastritis without bleeding: Secondary | ICD-10-CM | POA: Diagnosis not present

## 2016-05-27 MED ORDER — ONDANSETRON 4 MG PO TBDP: 4.0000 mg | ORAL_TABLET | Freq: Three times a day (TID) | ORAL | 0 refills | Status: DC | PRN

## 2016-05-27 MED ORDER — OMEPRAZOLE 20 MG PO CPDR: 20.0000 mg | DELAYED_RELEASE_CAPSULE | Freq: Two times a day (BID) | ORAL | 0 refills | Status: DC

## 2016-05-27 NOTE — ED Provider Notes (Signed)
CSN: [id number]     Arrival date & time 05/27/16  1637 History   None    Chief Complaint  Patient presents with  . Abdominal Cramping   (Consider location/radiation/quality/duration/timing/severity/associated sxs/prior Treatment) Paitent c/o abdominal cramping nausea and vomiting for a day.   The history is provided by the patient.  Abdominal Cramping  This is a new problem. The current episode started yesterday. The problem occurs constantly. The problem has not changed since onset.Associated symptoms include abdominal pain. Nothing aggravates the symptoms.    Past Medical History:  Diagnosis Date  . Asthma   . Emphysema    Past Surgical History:  Procedure Laterality Date  . TONSILLECTOMY     History reviewed. No pertinent family history. Social History  Substance Use Topics  . Smoking status: Current Every Day Smoker    Packs/day: 0.50    Years: 13.00    Types: Cigarettes  . Smokeless tobacco: Never Used  . Alcohol use No    Review of Systems  Constitutional: Negative.   HENT: Negative.   Eyes: Negative.   Respiratory: Negative.   Cardiovascular: Negative.   Gastrointestinal: Positive for abdominal pain and nausea.  Endocrine: Negative.   Genitourinary: Negative.   Allergic/Immunologic: Negative.   Neurological: Negative.     Allergies  Patient has no known allergies.  Home Medications   Prior to Admission medications   Medication Sig Start Date End Date Taking? Authorizing Provider  tiotropium (SPIRIVA) 18 MCG inhalation capsule Place 18 mcg into inhaler and inhale daily.   Yes [provider]  omeprazole (PRILOSEC) 20 MG capsule Take 1 capsule (20 mg total) by mouth 2 (two) times daily before a meal. 05/27/16   Oxford, Anselm PancoastWilliam J, FNP  ondansetron (ZOFRAN ODT) 4 MG disintegrating tablet Take 1 tablet (4 mg total) by mouth every 8 (eight) hours as needed for nausea or vomiting. 05/27/16   Deatra Canterxford, William J, FNP   Meds Ordered and Administered this  Visit  Medications - No data to display  BP 111/72 (BP Location: Right Arm)   Pulse 61   Temp 98.9 F (37.2 C) (Oral)   Resp 16   SpO2 100%  No data found.   Physical Exam  Constitutional: He appears well-developed and well-nourished.  HENT:  Head: Normocephalic and atraumatic.  Eyes: Conjunctivae and EOM are normal. Pupils are equal, round, and reactive to light.  Neck: Normal range of motion. Neck supple.  Cardiovascular: Normal rate, regular rhythm and normal heart sounds.   Pulmonary/Chest: Effort normal and breath sounds normal.  Abdominal: Soft. Bowel sounds are normal.  Nursing note and vitals reviewed.   Urgent Care Course     Procedures (including critical care time)  Labs Review Labs Reviewed - No data to display  Imaging Review No results found.   Visual Acuity Review  Right Eye Distance:   Left Eye Distance:   Bilateral Distance:    Right Eye Near:   Left Eye Near:    Bilateral Near:         MDM   1. Acute superficial gastritis without hemorrhage   2. Nausea    Zofran ODT 4mg  one po tid prn #21  Push po fluids, rest, tylenol and motrin otc prn as directed for fever, arthralgias, and myalgias.  Follow up prn if sx's continue or persist.    Deatra CanterOxford, William J, FNP 05/27/16 2024

## 2016-05-27 NOTE — ED Triage Notes (Signed)
[facility] a [facility] pain and cramping with nausea after eating. The patient reported that he works around [employer].

## 2016-05-28 ENCOUNTER — Emergency Department (HOSPITAL_COMMUNITY)
Admission: EM | Admit: 2016-05-28 | Discharge: 2016-05-28 | Disposition: A | Discharge status: Home / Self Care | Payer: No Typology Code available for payment source | Attending: Emergency Medicine | Admitting: Emergency Medicine

## 2016-05-28 ENCOUNTER — Encounter (HOSPITAL_COMMUNITY): Payer: Self-pay | Admitting: Emergency Medicine

## 2016-05-28 DIAGNOSIS — Z79899 Other long term (current) drug therapy: Secondary | ICD-10-CM | POA: Insufficient documentation

## 2016-05-28 DIAGNOSIS — J45909 Unspecified asthma, uncomplicated: Secondary | ICD-10-CM | POA: Insufficient documentation

## 2016-05-28 DIAGNOSIS — R112 Nausea with vomiting, unspecified: Secondary | ICD-10-CM | POA: Insufficient documentation

## 2016-05-28 DIAGNOSIS — R197 Diarrhea, unspecified: Secondary | ICD-10-CM

## 2016-05-28 DIAGNOSIS — F17210 Nicotine dependence, cigarettes, uncomplicated: Secondary | ICD-10-CM | POA: Insufficient documentation

## 2016-05-28 DIAGNOSIS — R109 Unspecified abdominal pain: Secondary | ICD-10-CM

## 2016-05-28 LAB — CBC WITH DIFFERENTIAL/PLATELET
BASOS ABS: 0 10*3/uL (ref 0.0–0.1)
BASOS PCT: 0 %
Eosinophils Absolute: 0.2 10*3/uL (ref 0.0–0.7)
Eosinophils Relative: 3 %
HCT: 39.6 % (ref 39.0–52.0)
Hemoglobin: 13.8 g/dL (ref 13.0–17.0)
LYMPHS PCT: 34 %
Lymphs Abs: 2.4 10*3/uL (ref 0.7–4.0)
MCH: 31.5 pg (ref 26.0–34.0)
MCHC: 34.8 g/dL (ref 30.0–36.0)
MCV: 90.4 fL (ref 78.0–100.0)
MONO ABS: 0.4 10*3/uL (ref 0.1–1.0)
Monocytes Relative: 6 %
NEUTROS PCT: 57 %
Neutro Abs: 4.1 10*3/uL (ref 1.7–7.7)
PLATELETS: 128 10*3/uL — AB (ref 150–400)
RBC: 4.38 MIL/uL (ref 4.22–5.81)
RDW: 12.4 % (ref 11.5–15.5)
WBC: 7.1 10*3/uL (ref 4.0–10.5)

## 2016-05-28 LAB — URINALYSIS, ROUTINE W REFLEX MICROSCOPIC
Bilirubin Urine: NEGATIVE
GLUCOSE, UA: NEGATIVE mg/dL
Hgb urine dipstick: NEGATIVE
Ketones, ur: NEGATIVE mg/dL
LEUKOCYTES UA: NEGATIVE
Nitrite: NEGATIVE
PROTEIN: NEGATIVE mg/dL
Specific Gravity, Urine: 1.012 (ref 1.005–1.030)
pH: 7 (ref 5.0–8.0)

## 2016-05-28 LAB — COMPREHENSIVE METABOLIC PANEL
ALK PHOS: 59 U/L (ref 38–126)
ALT: 12 U/L — ABNORMAL LOW (ref 17–63)
AST: 17 U/L (ref 15–41)
Albumin: 4 g/dL (ref 3.5–5.0)
Anion gap: 8 (ref 5–15)
BILIRUBIN TOTAL: 0.5 mg/dL (ref 0.3–1.2)
BUN: 13 mg/dL (ref 6–20)
CO2: 25 mmol/L (ref 22–32)
Calcium: 9.2 mg/dL (ref 8.9–10.3)
Chloride: 105 mmol/L (ref 101–111)
Creatinine, Ser: 0.97 mg/dL (ref 0.61–1.24)
GLUCOSE: 92 mg/dL (ref 65–99)
POTASSIUM: 4.3 mmol/L (ref 3.5–5.1)
Sodium: 138 mmol/L (ref 135–145)
TOTAL PROTEIN: 6.4 g/dL — AB (ref 6.5–8.1)

## 2016-05-28 LAB — LIPASE, BLOOD: LIPASE: 36 U/L (ref 11–51)

## 2016-05-28 MED ORDER — DICYCLOMINE HCL 10 MG PO CAPS
10.0000 mg | ORAL_CAPSULE | Freq: Once | ORAL | Status: AC
  Administered 2016-05-28: 10 mg via ORAL
  Filled 2016-05-28: qty 1

## 2016-05-28 MED ORDER — GI COCKTAIL ~~LOC~~
30.0000 mL | Freq: Once | ORAL | Status: AC
  Administered 2016-05-28: 30 mL via ORAL
  Filled 2016-05-28: qty 30

## 2016-05-28 MED ORDER — ONDANSETRON HCL 4 MG/2ML IJ SOLN
4.0000 mg | Freq: Once | INTRAMUSCULAR | Status: AC
  Administered 2016-05-28: 4 mg via INTRAVENOUS
  Filled 2016-05-28: qty 2

## 2016-05-28 MED ORDER — LACTATED RINGERS IV BOLUS (SEPSIS)
1000.0000 mL | Freq: Once | INTRAVENOUS | Status: AC
  Administered 2016-05-28: 1000 mL via INTRAVENOUS

## 2016-05-28 MED ORDER — DICYCLOMINE HCL 10 MG PO CAPS: 10.0000 mg | ORAL_CAPSULE | Freq: Three times a day (TID) | ORAL | 0 refills | Status: DC

## 2016-05-28 NOTE — ED Triage Notes (Signed)
Pt sts abd pain with N/V/D; pt sts hx of same and [facility]

## 2016-05-28 NOTE — ED Provider Notes (Signed)
[facility]-EMERGENCY DEPT Provider Note   CSN: [id number] Arrival date & time: 05/28/16  [id number]     History   Chief Complaint Chief Complaint  Patient presents with  . Abdominal Pain    HPI Casey Gallagher is a 26 y.o. male.  26 year old male who presents with nausea, vomiting, and diarrhea. The patient reports a several month history of intermittent nausea and diarrhea, occasional episodes of vomiting. His symptoms wax and wane, sometimes resolving but then returning spontaneously. His symptoms have been worse over the past 3 days. He was evaluated by an urgent care yesterday and discharged. He denies any blood in his stool, urinary symptoms, fevers, or sick contacts. He has never had PCP evaluation for this before. He denies significant NSAID or alcohol use. He denies marijuana use. His nausea is currently mild. He has had intermittent sharp abdominal pain over the past 3 days, no significant pain currently.   The history is provided by the patient.  Abdominal Pain      Past Medical History:  Diagnosis Date  . Asthma   . Emphysema     There are no active problems to display for this patient.   Past Surgical History:  Procedure Laterality Date  . TONSILLECTOMY         Home Medications    Prior to Admission medications   Medication Sig Start Date End Date Taking? Authorizing Provider  dicyclomine (BENTYL) 10 MG capsule Take 1 capsule (10 mg total) by mouth 4 (four) times daily -  before meals and at bedtime. 05/28/16   Oneika Simonian, Ambrose Finlandachel Morgan, MD  omeprazole (PRILOSEC) 20 MG capsule Take 1 capsule (20 mg total) by mouth 2 (two) times daily before a meal. 05/27/16   Oxford, Anselm PancoastWilliam J, FNP  ondansetron (ZOFRAN ODT) 4 MG disintegrating tablet Take 1 tablet (4 mg total) by mouth every 8 (eight) hours as needed for nausea or vomiting. 05/27/16   Deatra Canterxford, William J, FNP  tiotropium (SPIRIVA) 18 MCG inhalation capsule Place 18 mcg into inhaler and inhale daily.    [provider]     Family History History reviewed. No pertinent family history.  Social History Social History  Substance Use Topics  . Smoking status: Current Every Day Smoker    Packs/day: 0.50    Years: 13.00    Types: Cigarettes  . Smokeless tobacco: Never Used  . Alcohol use No     Allergies   Patient has no known allergies.   Review of Systems Review of Systems  Gastrointestinal: Positive for abdominal pain.   All other systems reviewed and are negative except that which was mentioned in HPI  Physical Exam Updated Vital Signs BP 114/76 (BP Location: Right Arm)   Pulse (!) 50   Temp 97.8 F (36.6 C) (Oral)   Resp 17   SpO2 100%   Physical Exam  Constitutional: He is oriented to person, place, and time. He appears well-developed and well-nourished. No distress.  HENT:  Head: Normocephalic and atraumatic.  Moist mucous membranes  Eyes: Conjunctivae are normal. Pupils are equal, round, and reactive to light.  Neck: Neck supple.  Cardiovascular: Normal rate, regular rhythm and normal heart sounds.   No murmur heard. Pulmonary/Chest: Effort normal and breath sounds normal.  Abdominal: Soft. Bowel sounds are normal. He exhibits no distension and no mass. There is no tenderness.  Musculoskeletal: He exhibits no edema.  Neurological: He is alert and oriented to person, place, and time.  Fluent speech  Skin: Skin is warm and  dry.  Psychiatric: He has a normal mood and affect. Judgment normal.  Nursing note and vitals reviewed.    ED Treatments / Results  Labs (all labs ordered are listed, but only abnormal results are displayed) Labs Reviewed  COMPREHENSIVE METABOLIC PANEL - Abnormal; Notable for the following:       Result Value   Total Protein 6.4 (*)    ALT 12 (*)    All other components within normal limits  CBC WITH DIFFERENTIAL/PLATELET - Abnormal; Notable for the following:    Platelets 128 (*)    All other components within normal limits  URINALYSIS, ROUTINE  W REFLEX MICROSCOPIC  LIPASE, BLOOD    EKG  EKG Interpretation None       Radiology No results found.  Procedures Procedures (including critical care time)  Medications Ordered in ED Medications  ondansetron (ZOFRAN) injection 4 mg (4 mg Intravenous Given 05/28/16 1052)  lactated ringers bolus 1,000 mL (0 mLs Intravenous Stopped 05/28/16 1253)  dicyclomine (BENTYL) capsule 10 mg (10 mg Oral Given 05/28/16 1356)  gi cocktail (Maalox,Lidocaine,Donnatal) (30 mLs Oral Given 05/28/16 1355)     Initial Impression / Assessment and Plan / ED Course  I have reviewed the triage vital signs and the nursing notes.  Pertinent labs & imaging results that were available during my care of the patient were reviewed by me and considered in my medical decision making (see chart for details).     Pt p/w Several months of intermittent nausea, vomiting, and diarrhea associated with abdominal pain. On my exam, he was well-appearing with normal vital signs. He reported some lower abdominal pain but had no focal tenderness on exam. Obtained above lab work which shows normal LFTs and lipase, normal WBC count, normal UA with no evidence of dehydration. Gave Bentyl and GI cocktail. Because of the chronicity of his problems, he may benefit from GI referral from his PCP for consideration of further workup to include etiologies such as inflammatory bowel disease. Given his reassuring workup and exam, as well as the fact that he is tolerating liquids here, I feel he is safe for discharge. I discussed supportive measures at home and provided with nausea medication to use as needed. Extensively reviewed return precautions. Patient voiced understanding and was discharged in satisfactory condition.  Final Clinical Impressions(s) / ED Diagnoses   Final diagnoses:  Nausea vomiting and diarrhea  Abdominal pain, unspecified abdominal location    New Prescriptions New Prescriptions   DICYCLOMINE (BENTYL) 10 MG  CAPSULE    Take 1 capsule (10 mg total) by mouth 4 (four) times daily -  before meals and at bedtime.     Lashona Schaaf, Ambrose Finland, MD 05/28/16 [phone]

## 2016-06-02 ENCOUNTER — Emergency Department (HOSPITAL_COMMUNITY): Payer: Self-pay

## 2016-06-02 ENCOUNTER — Emergency Department (HOSPITAL_COMMUNITY)
Admission: EM | Admit: 2016-06-02 | Discharge: 2016-06-02 | Disposition: A | Discharge status: Home / Self Care | Payer: Self-pay | Attending: Emergency Medicine | Admitting: Emergency Medicine

## 2016-06-02 ENCOUNTER — Encounter (HOSPITAL_COMMUNITY): Payer: Self-pay | Admitting: Emergency Medicine

## 2016-06-02 DIAGNOSIS — F17210 Nicotine dependence, cigarettes, uncomplicated: Secondary | ICD-10-CM | POA: Insufficient documentation

## 2016-06-02 DIAGNOSIS — R109 Unspecified abdominal pain: Secondary | ICD-10-CM

## 2016-06-02 DIAGNOSIS — R197 Diarrhea, unspecified: Secondary | ICD-10-CM | POA: Insufficient documentation

## 2016-06-02 DIAGNOSIS — J45909 Unspecified asthma, uncomplicated: Secondary | ICD-10-CM | POA: Insufficient documentation

## 2016-06-02 DIAGNOSIS — R1030 Lower abdominal pain, unspecified: Secondary | ICD-10-CM | POA: Insufficient documentation

## 2016-06-02 LAB — CBC WITH DIFFERENTIAL/PLATELET
BASOS ABS: 0 10*3/uL (ref 0.0–0.1)
Basophils Relative: 0 %
EOS ABS: 0.1 10*3/uL (ref 0.0–0.7)
EOS PCT: 1 %
HCT: 41.3 % (ref 39.0–52.0)
HEMOGLOBIN: 14.3 g/dL (ref 13.0–17.0)
LYMPHS PCT: 22 %
Lymphs Abs: 1.8 10*3/uL (ref 0.7–4.0)
MCH: 31.2 pg (ref 26.0–34.0)
MCHC: 34.6 g/dL (ref 30.0–36.0)
MCV: 90.2 fL (ref 78.0–100.0)
Monocytes Absolute: 0.6 10*3/uL (ref 0.1–1.0)
Monocytes Relative: 7 %
NEUTROS PCT: 70 %
Neutro Abs: 5.6 10*3/uL (ref 1.7–7.7)
PLATELETS: 145 10*3/uL — AB (ref 150–400)
RBC: 4.58 MIL/uL (ref 4.22–5.81)
RDW: 12.4 % (ref 11.5–15.5)
WBC: 8.1 10*3/uL (ref 4.0–10.5)

## 2016-06-02 LAB — URINALYSIS, ROUTINE W REFLEX MICROSCOPIC
Bilirubin Urine: NEGATIVE
Glucose, UA: NEGATIVE mg/dL
Hgb urine dipstick: NEGATIVE
Ketones, ur: NEGATIVE mg/dL
Leukocytes, UA: NEGATIVE
NITRITE: NEGATIVE
PH: 9 — AB (ref 5.0–8.0)
Protein, ur: NEGATIVE mg/dL
SPECIFIC GRAVITY, URINE: 1.031 — AB (ref 1.005–1.030)

## 2016-06-02 LAB — COMPREHENSIVE METABOLIC PANEL
ALBUMIN: 4.2 g/dL (ref 3.5–5.0)
ALT: 12 U/L — AB (ref 17–63)
AST: 19 U/L (ref 15–41)
Alkaline Phosphatase: 59 U/L (ref 38–126)
Anion gap: 11 (ref 5–15)
BUN: 8 mg/dL (ref 6–20)
CHLORIDE: 108 mmol/L (ref 101–111)
CO2: 21 mmol/L — AB (ref 22–32)
CREATININE: 0.98 mg/dL (ref 0.61–1.24)
Calcium: 9.3 mg/dL (ref 8.9–10.3)
GFR calc Af Amer: >60 mL/min (ref >60)
GFR calc non Af Amer: >60 mL/min (ref >60)
GLUCOSE: 93 mg/dL (ref 65–99)
Potassium: 3.5 mmol/L (ref 3.5–5.1)
SODIUM: 140 mmol/L (ref 135–145)
Total Bilirubin: 0.7 mg/dL (ref 0.3–1.2)
Total Protein: 6.7 g/dL (ref 6.5–8.1)

## 2016-06-02 LAB — LIPASE, BLOOD: Lipase: 39 U/L (ref 11–51)

## 2016-06-02 MED ORDER — ONDANSETRON 4 MG PO TBDP: 4.0000 mg | ORAL_TABLET | Freq: Three times a day (TID) | ORAL | 0 refills | Status: DC | PRN

## 2016-06-02 MED ORDER — DICYCLOMINE HCL 20 MG PO TABS: 20.0000 mg | ORAL_TABLET | Freq: Two times a day (BID) | ORAL | 1 refills | Status: DC

## 2016-06-02 MED ORDER — SODIUM CHLORIDE 0.9 % IV BOLUS (SEPSIS)
1000.0000 mL | Freq: Once | INTRAVENOUS | Status: AC
  Administered 2016-06-02: 1000 mL via INTRAVENOUS

## 2016-06-02 MED ORDER — ONDANSETRON HCL 4 MG/2ML IJ SOLN
4.0000 mg | Freq: Once | INTRAMUSCULAR | Status: AC
  Administered 2016-06-02: 4 mg via INTRAVENOUS
  Filled 2016-06-02: qty 2

## 2016-06-02 MED ORDER — MORPHINE SULFATE (PF) 4 MG/ML IV SOLN
4.0000 mg | Freq: Once | INTRAVENOUS | Status: AC
  Administered 2016-06-02: 4 mg via INTRAVENOUS
  Filled 2016-06-02: qty 1

## 2016-06-02 MED ORDER — IOPAMIDOL (ISOVUE-300) INJECTION 61%
INTRAVENOUS | Status: AC
Start: 2016-06-02 — End: 2016-06-02
  Administered 2016-06-02: 100 mL
  Filled 2016-06-02: qty 100

## 2016-06-02 NOTE — ED Provider Notes (Signed)
[facility] Provider Note   CSN: [id number] Arrival date & time: 06/02/16  0757     History   Chief Complaint Chief Complaint  Patient presents with  . Diarrhea  . Abdominal Pain    HPI Casey Gallagher is a 26 y.o. male.  The history is provided by the patient and medical records.  Diarrhea   Associated symptoms include abdominal pain.  Abdominal Pain   Associated symptoms include diarrhea.    26 year old male with history of asthma and emphysema, presenting to the ED with abdominal pain. This is his third evaluation for similar symptoms. States this began about a week ago but has been persistent. States he was seen initially at urgent care and started on antacid and Zofran, but reports his nausea and vomiting symptoms have resolved. States for the past 4 or 5 days he is mostly just had significant lower abdominal pain with diarrhea. States generally he has these issues once he has a bowel movement his pain will resolve, however he states his pain gets worse after having a bowel movement. States yesterday he did notice a little bit of "red" in his stool, however is not sure if it was blood or from the red Gatorade he has been drinking. States yesterday he tried the his first solid food in a few days, but had diarrhea immediately afterwards. He has no known GI history. His brother does have Crohn's disease and father is concerned that he may have some component of IBS. He has not had any formal imaging thus far.  Father reports his symptoms seem to improve with the Bentyl he was prescribed previously, however prescription ran out.  Past Medical History:  Diagnosis Date  . Asthma   . Emphysema     There are no active problems to display for this patient.   Past Surgical History:  Procedure Laterality Date  . TONSILLECTOMY         Home Medications    Prior to Admission medications   Medication Sig Start Date End Date Taking? Authorizing Provider  dicyclomine (BENTYL)  10 MG capsule Take 1 capsule (10 mg total) by mouth 4 (four) times daily -  before meals and at bedtime. 05/28/16   Little, Ambrose Finland, MD  omeprazole (PRILOSEC) 20 MG capsule Take 1 capsule (20 mg total) by mouth 2 (two) times daily before a meal. 05/27/16   Oxford, Anselm Pancoast, FNP  ondansetron (ZOFRAN ODT) 4 MG disintegrating tablet Take 1 tablet (4 mg total) by mouth every 8 (eight) hours as needed for nausea or vomiting. 05/27/16   Deatra Canter, FNP  tiotropium (SPIRIVA) 18 MCG inhalation capsule Place 18 mcg into inhaler and inhale daily.    [provider]    Family History History reviewed. No pertinent family history.  Social History Social History  Substance Use Topics  . Smoking status: Current Every Day Smoker    Packs/day: 0.50    Years: 13.00    Types: Cigarettes  . Smokeless tobacco: Never Used  . Alcohol use No     Allergies   Patient has no known allergies.   Review of Systems Review of Systems  Gastrointestinal: Positive for abdominal pain and diarrhea.  All other systems reviewed and are negative.    Physical Exam Updated Vital Signs BP 112/80 (BP Location: Right Arm)   Pulse 64   Temp 98.3 F (36.8 C) (Oral)   Resp 12   SpO2 100%   Physical Exam  Constitutional: He is oriented  to person, place, and time. He appears well-developed and well-nourished.  HENT:  Head: Normocephalic and atraumatic.  Mouth/Throat: Oropharynx is clear and moist.  Eyes: Conjunctivae and EOM are normal. Pupils are equal, round, and reactive to light.  Neck: Normal range of motion.  Cardiovascular: Normal rate, regular rhythm and normal heart sounds.   Pulmonary/Chest: Effort normal and breath sounds normal.  Abdominal: Soft. Bowel sounds are normal. There is tenderness in the right lower quadrant, suprapubic area and left lower quadrant. There is no rigidity and no guarding.  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  Neurological: He is alert and oriented to person, place, and time.  Skin: Skin is warm and dry.  Psychiatric: He has a normal mood and affect.  Nursing note and vitals reviewed.    ED Treatments / Results  Labs (all labs ordered are listed, but only abnormal results are displayed) Labs Reviewed  CBC WITH DIFFERENTIAL/PLATELET - Abnormal; Notable for the following:       Result Value   Platelets 145 (*)    All other components within normal limits  COMPREHENSIVE METABOLIC PANEL - Abnormal; Notable for the following:    CO2 21 (*)    ALT 12 (*)    All other components within normal limits  URINALYSIS, ROUTINE W REFLEX MICROSCOPIC - Abnormal; Notable for the following:    Color, Urine STRAW (*)    Specific Gravity, Urine 1.031 (*)    pH 9.0 (*)    All other components within normal limits  LIPASE, BLOOD    EKG  EKG Interpretation None       Radiology Ct Abdomen Pelvis W Contrast  Result Date: 06/02/2016 CLINICAL DATA:  Abdominal pain EXAM: CT ABDOMEN AND PELVIS WITH CONTRAST TECHNIQUE: Multidetector CT imaging of the abdomen and pelvis was performed using the standard protocol following bolus administration of intravenous contrast. CONTRAST:  ISOVUE-300 IOPAMIDOL (ISOVUE-300) INJECTION 61% COMPARISON:  None. FINDINGS: Lower chest: Lung bases are clear. No effusions. Heart is normal size. Hepatobiliary: No focal hepatic abnormality. Gallbladder unremarkable. Pancreas: No focal abnormality or ductal dilatation. Spleen: No focal abnormality.  Normal size. Adrenals/Urinary Tract: No adrenal abnormality. No focal renal abnormality. No stones or hydronephrosis. Urinary bladder is unremarkable. Stomach/Bowel: Evaluation of bowel is limited without oral contrast. Unable to visualize the appendix. No visible inflammatory process in the right lower quadrant to suggest appendicitis. Stomach and  small bowel decompressed, grossly unremarkable. Vascular/Lymphatic: No evidence of aneurysm or adenopathy. Reproductive: No visible focal abnormality. Other: Small amount of free fluid in the pelvis. Musculoskeletal: No acute bony abnormality. IMPRESSION: Small amount of free fluid in the pelvis. Exact cause is not clear. Evaluation of bowel is somewhat limited by lack of oral contrast, but no visible abnormal bowel segment. Unable to visualize the appendix, but no inflammatory process seen in the right lower quadrant. Electronically Signed   By: Charlett Nose M.D.   On: 06/02/2016 10:59    Procedures Procedures (including critical care time)  Medications Ordered in ED Medications  sodium chloride 0.9 % bolus 1,000 mL (0 mLs Intravenous Stopped 06/02/16 1236)  ondansetron (ZOFRAN) injection 4 mg (4 mg Intravenous Given 06/02/16 0952)  morphine 4 MG/ML injection 4 mg (4 mg Intravenous Given 06/02/16 0952)  iopamidol (ISOVUE-300) 61 % injection (100 mLs  Contrast Given 06/02/16 1031)     Initial Impression / Assessment and Plan /  ED Course  I have reviewed the triage vital signs and the nursing notes.  Pertinent labs & imaging results that were available during my care of the patient were reviewed by me and considered in my medical decision making (see chart for details).  26 y.o. M Here with abdominal pain and continued diarrhea. This is his third evaluation for the same.  He is afebrile and nontoxic in appearance. Screening lab work is overall reassuring. UA without signs of infection. Given this is his third evaluation for ongoing symptoms, CT scan was obtained with nonspecific fluid in the pelvis.  No abnormal bowel segments or signs of inflammation noted.  Symptoms improved here with IV fluids and medications. Tolerating oral fluids well this time. Lower suspicion for C. difficile or other infectious diarrhea.  May be component of IBS or other pathology.  Will have him follow-up closely with GI--  fiance has called [facility] to make appt.  Will re-start bentyl as he was having improvement with this.  Discussed plan with patient, he acknowledged understanding and agreed with plan of care.  Return precautions given for new or worsening symptoms.  Final Clinical Impressions(s) / ED Diagnoses   Final diagnoses:  Abdominal pain, unspecified abdominal location  Diarrhea, unspecified type    New Prescriptions Discharge Medication List as of 06/02/2016 12:28 PM    START taking these medications   Details  dicyclomine (BENTYL) 20 MG tablet Take 1 tablet (20 mg total) by mouth 2 (two) times daily., Starting Tue 06/02/2016, Print         Garlon HatchetSanders, Neriah Brott M, PA-C 06/02/16 1301    Little, Ambrose Finlandachel Morgan, MD 06/03/16 [phone]

## 2016-06-02 NOTE — ED Triage Notes (Signed)
Pt sts lower abd pain with diarrhea x 2 days; pt sts some blood in stool noted

## 2016-06-02 NOTE — Discharge Instructions (Signed)
Take the prescribed medication as directed. Follow-up with GI-- call to make an appt. Return to the ED for new or worsening symptoms.

## 2016-11-26 ENCOUNTER — Encounter (HOSPITAL_COMMUNITY): Payer: Self-pay | Admitting: Emergency Medicine

## 2016-11-26 ENCOUNTER — Other Ambulatory Visit: Payer: Self-pay

## 2016-11-26 ENCOUNTER — Ambulatory Visit (HOSPITAL_COMMUNITY)
Admission: EM | Admit: 2016-11-26 | Discharge: 2016-11-26 | Disposition: A | Discharge status: Home / Self Care | Payer: Self-pay | Attending: Emergency Medicine | Admitting: Emergency Medicine

## 2016-11-26 DIAGNOSIS — S39012A Strain of muscle, fascia and tendon of lower back, initial encounter: Secondary | ICD-10-CM

## 2016-11-26 DIAGNOSIS — M62830 Muscle spasm of back: Secondary | ICD-10-CM

## 2016-11-26 DIAGNOSIS — G8929 Other chronic pain: Secondary | ICD-10-CM

## 2016-11-26 DIAGNOSIS — S29019D Strain of muscle and tendon of unspecified wall of thorax, subsequent encounter: Secondary | ICD-10-CM

## 2016-11-26 DIAGNOSIS — M545 Low back pain: Secondary | ICD-10-CM

## 2016-11-26 MED ORDER — METHOCARBAMOL 500 MG PO TABS: 500.0000 mg | ORAL_TABLET | Freq: Two times a day (BID) | ORAL | 0 refills | Status: DC

## 2016-11-26 MED ORDER — KETOROLAC TROMETHAMINE 60 MG/2ML IM SOLN
INTRAMUSCULAR | Status: AC
  Filled 2016-11-26: qty 2

## 2016-11-26 MED ORDER — KETOROLAC TROMETHAMINE 60 MG/2ML IM SOLN
45.0000 mg | Freq: Once | INTRAMUSCULAR | Status: AC
  Administered 2016-11-26: 45 mg via INTRAMUSCULAR

## 2016-11-26 MED ORDER — TRAMADOL HCL 50 MG PO TABS: 50.0000 mg | ORAL_TABLET | Freq: Four times a day (QID) | ORAL | 0 refills | Status: DC | PRN

## 2016-11-26 MED ORDER — DICLOFENAC POTASSIUM 50 MG PO TABS: 50.0000 mg | ORAL_TABLET | Freq: Three times a day (TID) | ORAL | 0 refills | Status: DC

## 2016-11-26 NOTE — ED Triage Notes (Signed)
Patient has chronic back issues.  Back soreness increased after unloading a truck.  Back pain has increased since then.  Ripping feeling in shoulder blades and pressure in lower back.    Yesterday had an episode of feeling over heated and severalepisodes of vomiting.  No vomiting today.  Feeling lightheaded

## 2016-11-26 NOTE — ED Provider Notes (Signed)
[facility]    CSN: [id number] Arrival date & time: 11/26/16  1211     History   Chief Complaint Chief Complaint  Patient presents with  . Back Pain    HPI Casey Gallagher is a 26 y.o. male.   26 year old male states he has a history of chronic back pain secondary to muscle overuse and strain. More recently he was unloading a truck and reinjured his back exacerbating muscle pain. Denies any known history of spinal injury. He states that the pain is located along the parathoracic musculature and across the low back. Worse with movement. Yesterday he felt hot, when outside and started vomiting. This ended around 2 AM today. He said no fever or chills. No blunt trauma. He admits that lifting boxes while unloading the truck cost and exacerbated the pain in his back.      Past Medical History:  Diagnosis Date  . Asthma   . Emphysema     There are no active problems to display for this patient.   Past Surgical History:  Procedure Laterality Date  . TONSILLECTOMY         Home Medications    Prior to Admission medications   Medication Sig Start Date End Date Taking? Authorizing Provider  albuterol (PROVENTIL HFA;VENTOLIN HFA) 108 (90 Base) MCG/ACT inhaler Inhale 2 puffs into the lungs every 6 (six) hours as needed for wheezing or shortness of breath.   Yes [provider]  tiotropium (SPIRIVA) 18 MCG inhalation capsule Place 18 mcg into inhaler and inhale daily.   Yes [provider]  diclofenac (CATAFLAM) 50 MG tablet Take 1 tablet (50 mg total) 3 (three) times daily by mouth. 11/26/16   Noreen Mackintosh, Onalee Hua, NP  dicyclomine (BENTYL) 20 MG tablet Take 1 tablet (20 mg total) by mouth 2 (two) times daily. 06/02/16   Garlon Hatchet, PA-C  methocarbamol (ROBAXIN) 500 MG tablet Take 1 tablet (500 mg total) 2 (two) times daily by mouth. 11/26/16   Hayden Rasmussen, NP  ondansetron (ZOFRAN ODT) 4 MG disintegrating tablet Take 1 tablet (4 mg total) by mouth every 8  (eight) hours as needed for nausea. 06/02/16   Garlon Hatchet, PA-C  traMADol (ULTRAM) 50 MG tablet Take 1 tablet (50 mg total) every 6 (six) hours as needed by mouth. 11/26/16   Hayden Rasmussen, NP    Family History No family history on file.  Social History Social History   Tobacco Use  . Smoking status: Current Every Day Smoker    Packs/day: 0.50    Years: 13.00    Pack years: 6.50    Types: Cigarettes  . Smokeless tobacco: Never Used  Substance Use Topics  . Alcohol use: No  . Drug use: No     Allergies   Patient has no known allergies.   Review of Systems Review of Systems  Constitutional: Positive for activity change.  HENT: Negative.   Respiratory: Negative.   Gastrointestinal: Negative.   Genitourinary: Negative.   Musculoskeletal: Positive for back pain and myalgias. Negative for neck pain.       As per HPI  Skin: Negative.   Neurological: Negative for dizziness, weakness, numbness and headaches.     Physical Exam Triage Vital Signs ED Triage Vitals [11/26/16 1225]  Enc Vitals Group     BP      Pulse      Resp      Temp      Temp src  SpO2      Weight      Height      Head Circumference      Peak Flow      Pain Score 6     Pain Loc      Pain Edu?      Excl. in GC?    No data found.  Updated Vital Signs BP 124/76 (BP Location: Left Arm) Comment (BP Location): small adult  Pulse 71   Temp (!) 97 F (36.1 C) (Oral)   Resp 18   SpO2 98%   Visual Acuity Right Eye Distance:   Left Eye Distance:   Bilateral Distance:    Right Eye Near:   Left Eye Near:    Bilateral Near:     Physical Exam  Constitutional: He is oriented to person, place, and time. He appears well-developed and well-nourished. No distress.  Neck: Normal range of motion. Neck supple.  Cardiovascular: Normal rate.  Pulmonary/Chest: Effort normal.  Musculoskeletal: He exhibits tenderness. He exhibits no edema or deformity.  Patient is standing. Muscles of the back are  held in tonic contraction. The lightest touch to the skin overlying the muscles produces an exaggerated pain behavioral response. The muscles involved are the parathoracic muscles as well as the paralumbar muscles. There is tenderness to very light palpation along the lower thoracic and lumbar spine. The spinal processes are visible and appeared to be well aligned. No overlying skin changes, discoloration or deformities. Positive for axial load pain in the lower back. Moves all extremities. Good muscle tone.  Neurological: He is alert and oriented to person, place, and time.  Skin: Skin is warm and dry.  Nursing note and vitals reviewed.  Waddells sign suggesting exaggerated "psycological"  response to minimal stimuli.  UC Treatments / Results  Labs (all labs ordered are listed, but only abnormal results are displayed) Labs Reviewed - No data to display  EKG  EKG Interpretation None       Radiology No results found.  Procedures Procedures (including critical care time)  Medications Ordered in UC Medications  ketorolac (TORADOL) injection 45 mg (45 mg Intramuscular Given 11/26/16 1308)     Initial Impression / Assessment and Plan / UC Course  I have reviewed the triage vital signs and the nursing notes.  Pertinent labs & imaging results that were available during my care of the patient were reviewed by me and considered in my medical decision making (see chart for details).    Apply heat to the involved muscles. After a couple days start performing gentle stretches. Do not go to bed for treatment purposes. Need to continue to move around some. No heavy lifting or bending for the next 3-4 days. Take the medication as directed for pain, inflammation and muscle spasms. May cause drowsiness. Follow-up with your primary care doctor as needed.    Final Clinical Impressions(s) / UC Diagnoses   Final diagnoses:  Muscle spasm of back  Strain of lumbar region, initial encounter    Thoracic myofascial strain, subsequent encounter  Chronic bilateral low back pain without sciatica    ED Discharge Orders        Ordered    diclofenac (CATAFLAM) 50 MG tablet  3 times daily     11/26/16 1312    methocarbamol (ROBAXIN) 500 MG tablet  2 times daily     11/26/16 1312    traMADol (ULTRAM) 50 MG tablet  Every 6 hours PRN     11/26/16 1312  Controlled Substance Prescriptions [place] Controlled Substance Registry consulted? Yes, I have consulted the [place] Controlled Substances Registry for this patient, and feel the risk/benefit ratio today is favorable for proceeding with this prescription for a controlled substance.   Hayden RasmussenMabe, Hadar Elgersma, NP 11/26/16 1315

## 2016-11-26 NOTE — Discharge Instructions (Signed)
Apply heat to the involved muscles. After a couple days start performing gentle stretches. Do not go to bed for treatment purposes. Need to continue to move around some. No heavy lifting or bending for the next 3-4 days. Take the medication as directed for pain, inflammation and muscle spasms. May cause drowsiness. Follow-up with your primary care doctor as needed.

## 2017-03-13 ENCOUNTER — Ambulatory Visit (INDEPENDENT_AMBULATORY_CARE_PROVIDER_SITE_OTHER): Payer: Self-pay

## 2017-03-13 ENCOUNTER — Encounter (HOSPITAL_COMMUNITY): Payer: Self-pay

## 2017-03-13 ENCOUNTER — Ambulatory Visit (HOSPITAL_COMMUNITY)
Admission: EM | Admit: 2017-03-13 | Discharge: 2017-03-13 | Disposition: A | Discharge status: Home / Self Care | Payer: Self-pay | Attending: Family Medicine | Admitting: Family Medicine

## 2017-03-13 ENCOUNTER — Other Ambulatory Visit: Payer: Self-pay

## 2017-03-13 DIAGNOSIS — S61451A Open bite of right hand, initial encounter: Secondary | ICD-10-CM

## 2017-03-13 DIAGNOSIS — M79641 Pain in right hand: Secondary | ICD-10-CM

## 2017-03-13 DIAGNOSIS — S61452A Open bite of left hand, initial encounter: Secondary | ICD-10-CM

## 2017-03-13 DIAGNOSIS — W540XXA Bitten by dog, initial encounter: Secondary | ICD-10-CM

## 2017-03-13 MED ORDER — AMOXICILLIN-POT CLAVULANATE 875-125 MG PO TABS: 1.0000 | ORAL_TABLET | Freq: Two times a day (BID) | ORAL | 0 refills | Status: DC

## 2017-03-13 NOTE — ED Provider Notes (Signed)
[facility]   [id number] 03/13/17 Arrival Time: 1726  ASSESSMENT & PLAN:  1. Dog bite, initial encounter     Imaging: Dg Hand Complete Left  Result Date: 03/13/2017 CLINICAL DATA:  Left hand pain after dog bite. EXAM: LEFT HAND - COMPLETE 3+ VIEW COMPARISON:  None. FINDINGS: There is no evidence of fracture or dislocation. There is no evidence of arthropathy or other focal bone abnormality. Soft tissues are unremarkable. No radiopaque foreign body is noted. IMPRESSION: Normal left hand. Electronically Signed   By: Lupita RaiderJames  Green Jr, M.D.   On: 03/13/2017 18:35   Meds ordered this encounter  Medications  . amoxicillin-clavulanate (AUGMENTIN) 875-125 MG tablet    Sig: Take 1 tablet by mouth every 12 (twelve) hours.    Dispense:  14 tablet    Refill:  0   Observe for s/s of infection. OTC analgesics as needed. May f/u with his PCP or here if needed.  Reviewed expectations re: course of current medical issues. Questions answered. Outlined signs and symptoms indicating need for more acute intervention. Patient verbalized understanding. After Visit Summary given.  SUBJECTIVE: History from: patient. Casey Gallagher is a 27 y.o. male who reports a dog bite to his R and L hands. Mainly his dog latched onto his R hand. Dog adopted. He thinks this was a provoked bite. He reports dog's shots are UTD. Bite occurred a few hours ago. Moderate swelling of R hand since then. No sensation changes. Has FROM of both hands but reports soreness and stiffness of R hand. Minimal bleeding. Washed hands at home.  Reports his Td is UTD.  ROS: As per HPI.   OBJECTIVE:  Vitals:   03/13/17 1742  BP: 109/71  Pulse: 95  Resp: 16  Temp: 98.5 F (36.9 C)  TempSrc: Oral  SpO2: 98%    General appearance: alert; no distress Extremities: no cyanosis or edema; symmetrical with no gross deformities; localized tenderness over his right and left hands with scattered puncture marks (R>L) with moderate  swelling of R hand and no bruising; ROM: normal bilaterally CV: normal extremity capillary refill Skin: warm and dry Neurologic: normal gait; normal symmetric reflexes in all extremities; normal sensation in all extremities Psychological: alert and cooperative; normal mood and affect  No Known Allergies  Past Medical History:  Diagnosis Date  . Asthma   . Emphysema    Social History   Socioeconomic History  . Marital status: Single    Spouse name: Not on file  . Number of children: Not on file  . Years of education: Not on file  . Highest education level: Not on file  Social Needs  . Financial resource strain: Not on file  . Food insecurity - worry: Not on file  . Food insecurity - inability: Not on file  . Transportation needs - medical: Not on file  . Transportation needs - non-medical: Not on file  Occupational History  . Not on file  Tobacco Use  . Smoking status: Current Every Day Smoker    Packs/day: 0.50    Years: 13.00    Pack years: 6.50    Types: Cigarettes  . Smokeless tobacco: Never Used  Substance and Sexual Activity  . Alcohol use: No  . Drug use: No  . Sexual activity: Not on file  Other Topics Concern  . Not on file  Social History Narrative  . Not on file    Past Surgical History:  Procedure Laterality Date  . TONSILLECTOMY  [name], MD 03/17/17 [phone]

## 2017-03-13 NOTE — Discharge Instructions (Addendum)
Start taking and finish all of the antibiotic prescribed. You may use over the counter ibuprofen or acetaminophen as needed.

## 2017-03-13 NOTE — ED Triage Notes (Signed)
Pt presents today with a dog bite on both hands. States he and his wife adopted a dog that he thinks was abused. States he thinks the dog got in a defensive state and latched on to his hand and would not let go. Wants to make sure there is no damage to his hands due to not being able to move his left hand well.

## 2017-06-26 ENCOUNTER — Other Ambulatory Visit: Payer: Self-pay

## 2017-06-26 ENCOUNTER — Encounter (HOSPITAL_COMMUNITY): Payer: Self-pay | Admitting: Emergency Medicine

## 2017-06-26 ENCOUNTER — Emergency Department (HOSPITAL_COMMUNITY)
Admission: EM | Admit: 2017-06-26 | Discharge: 2017-06-26 | Disposition: A | Discharge status: Home / Self Care | Payer: Medicaid Other | Attending: Emergency Medicine | Admitting: Emergency Medicine

## 2017-06-26 DIAGNOSIS — F17210 Nicotine dependence, cigarettes, uncomplicated: Secondary | ICD-10-CM | POA: Insufficient documentation

## 2017-06-26 DIAGNOSIS — M62830 Muscle spasm of back: Secondary | ICD-10-CM | POA: Insufficient documentation

## 2017-06-26 DIAGNOSIS — J45909 Unspecified asthma, uncomplicated: Secondary | ICD-10-CM | POA: Insufficient documentation

## 2017-06-26 DIAGNOSIS — M549 Dorsalgia, unspecified: Secondary | ICD-10-CM | POA: Insufficient documentation

## 2017-06-26 MED ORDER — KETOROLAC TROMETHAMINE 15 MG/ML IJ SOLN
15.0000 mg | Freq: Once | INTRAMUSCULAR | Status: AC
  Administered 2017-06-26: 15 mg via INTRAMUSCULAR
  Filled 2017-06-26: qty 1

## 2017-06-26 MED ORDER — HYDROCODONE-ACETAMINOPHEN 5-325 MG PO TABS
1.0000 | ORAL_TABLET | Freq: Once | ORAL | Status: AC
  Administered 2017-06-26: 1 via ORAL
  Filled 2017-06-26: qty 1

## 2017-06-26 MED ORDER — TRAMADOL HCL 50 MG PO TABS: 50.0000 mg | ORAL_TABLET | Freq: Two times a day (BID) | ORAL | 0 refills | Status: DC | PRN

## 2017-06-26 MED ORDER — METHOCARBAMOL 500 MG PO TABS: 500.0000 mg | ORAL_TABLET | Freq: Two times a day (BID) | ORAL | 0 refills | Status: DC

## 2017-06-26 NOTE — ED Triage Notes (Signed)
Pt from home. States low back pain for a few days, was some better yesterday but then today after working a few hours began having excruciating low back pain. Relieved by nothing other than standing upright. Does not radiate down legs.

## 2017-06-26 NOTE — Discharge Instructions (Signed)
Take ibuprofen 3 times a day with meals. You may take up to 800 mg (4 pills) at a time. Do not take other anti-inflammatories at the same time open (Advil, Motrin, naproxen, Aleve). You may supplement with Tylenol if you need further pain control. Use Robaxin as needed for muscle stiffness or soreness. Have caution, as this may make you tired or groggy. Do not drive or operate heavy machinery while taking this medication.  Use tramadol as needed for severe or breakthrough pain. Use muscle creams (bengay, icy hot, salonpas) as needed for pain.  You may try a back back to help rest and support your muscles. You may follow up with your primary care doctor or contact  and wellness to establish primary care.  You should ask about getting an orange card. There is information at this website: [email or web address]/home/orange Return to the ER if you develop high fevers, numbness, loss of bowel or bladder control, or any new or concerning symptoms.

## 2017-06-27 NOTE — ED Provider Notes (Signed)
[facility] EMERGENCY DEPARTMENT Provider Note   CSN: [id number] Arrival date & time: 06/26/17  1906     History   Chief Complaint Chief Complaint  Patient presents with  . Back Pain    HPI Casey Gallagher is a 27 y.o. male presenting for evaluation of back pain.  Patient states he has a history of chronic back pain.  It intermittently flares.  He had a flare earlier this week, which resolved yesterday.  Today after working a few hours, he had recurrence of back pain.  It is constant, and described as a spasm.  Anytime he moves his arms or walks, he states it feels like it pulls.  It is improved when he is standing up and staying still, worsened with movement.  Nothing makes it better.  Pain is bilateral.  It is of the thoracic and low back.  He states this is normal location for pain.  He denies recent fall, trauma, or injury.  He denies fever, chill, rash, loss of bowel or bladder control, numbness, tingling, history of cancer, or history of IV drug use.  He has no other medical problems, does not take medications daily.  He has not taken anything for his pain today.  He has been prescribed muscle relaxers, NSAIDs, and tramadol in the past with improvement of his symptoms.  He does not have a primary care doctor.  He denies urinary symptoms.  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  HPI  Past Medical History:  Diagnosis Date  . Asthma   . Emphysema     There are no active problems to display for this patient.   Past Surgical History:  Procedure Laterality Date  . TONSILLECTOMY          Home Medications    Prior to Admission medications   Medication Sig Start Date End Date Taking? Authorizing Provider  albuterol (PROVENTIL HFA;VENTOLIN HFA) 108 (90 Base) MCG/ACT inhaler Inhale 2 puffs into the lungs every 6 (six) hours as needed for wheezing or shortness of breath.    [provider]    amoxicillin-clavulanate (AUGMENTIN) 875-125 MG tablet Take 1 tablet by mouth every 12 (twelve) hours. 03/13/17   [name], MD  methocarbamol (ROBAXIN) 500 MG tablet Take 1 tablet (500 mg total) by mouth 2 (two) times daily. 06/26/17   Amberlyn Martinezgarcia, PA-C  tiotropium (SPIRIVA) 18 MCG inhalation capsule Place 18 mcg into inhaler and inhale daily.    [provider]  traMADol (ULTRAM) 50 MG tablet Take 1 tablet (50 mg total) by mouth every 12 (twelve) hours as needed for severe pain. 06/26/17   Zaydenn Balaguer, PA-C    Family History No family history on file.  Social History Social History   Tobacco Use  . Smoking status: Current Every Day Smoker    Packs/day: 0.50    Years: 13.00    Pack years: 6.50    Types: Cigarettes  . Smokeless tobacco: Never Used  Substance Use Topics  . Alcohol use: No  . Drug use: No     Allergies   Patient has no known allergies.   Review of Systems Review of Systems  Constitutional: Negative for fever.  Musculoskeletal: Positive for back pain. Negative for neck pain.     Physical Exam Updated Vital Signs BP (!) 126/98 (BP Location: Left Arm)   Pulse 71   Temp 97.8 F (36.6 C) (Oral)   Resp  18   SpO2 100%   Physical Exam  Constitutional: He is oriented to person, place, and time. He appears well-developed and well-nourished. No distress.  Patient standing up and staying tensed because this is most comfortable.  Appears uncomfortable due to pain, but in no distress.  HENT:  Head: Normocephalic and atraumatic.  Eyes: Pupils are equal, round, and reactive to light. Conjunctivae and EOM are normal.  Neck: Normal range of motion. Neck supple.  No tenderness to palpation of midline C-spine.  No step-offs.  Cardiovascular: Normal rate, regular rhythm and intact distal pulses.  Pulmonary/Chest: Effort normal and breath sounds normal. No respiratory distress. He has no wheezes.  Abdominal: Soft. He exhibits no distension and no  mass. There is no tenderness. There is no guarding.  Musculoskeletal: Normal range of motion. He exhibits tenderness.  Tenderness palpation of entire thoracic and lower back without increased pain over midline spine.  No step-offs.  Muscles are tense.  Strength of bilateral upper extremities intact.  Patient is standing without difficulty.  Radial and pedal pulses intact bilaterally.  Soft compartments.  Neurological: He is alert and oriented to person, place, and time. No sensory deficit.  Skin: Skin is warm and dry. Capillary refill takes less than 2 seconds.  Psychiatric: He has a normal mood and affect.  Nursing note and vitals reviewed.    ED Treatments / Results  Labs (all labs ordered are listed, but only abnormal results are displayed) Labs Reviewed - No data to display  EKG None  Radiology No results found.  Procedures Procedures (including critical care time)  Medications Ordered in ED Medications  HYDROcodone-acetaminophen (NORCO/VICODIN) 5-325 MG per tablet 1 tablet (1 tablet Oral Given 06/26/17 1945)  ketorolac (TORADOL) 15 MG/ML injection 15 mg (15 mg Intramuscular Given 06/26/17 1945)     Initial Impression / Assessment and Plan / ED Course  I have reviewed the triage vital signs and the nursing notes.  Pertinent labs & imaging results that were available during my care of the patient were reviewed by me and considered in my medical decision making (see chart for details).     Patient presenting for evaluation of back pain.  Physical exam reassuring, neurovascularly intact.  No red flags for back pain.  Pain is reproducible with palpation of the musculature.  Likely musculoskeletal.  Doubt fracture, I do not believe x-rays will be beneficial.  Doubt vertebral injury, infection, spinal cord compression, myelopathy, or cauda equina syndrome.  Will treat symptomatically with NSAIDs, muscle relaxers, muscle creams.  Patient given tramadol for pain control, has received  one prescription in the past 2 years.  Patient given information to follow-up with primary care. Discussed orange card. At this time, patient appears safe for discharge.  Return precautions given.  Patient states he understands agrees plan.   Final Clinical Impressions(s) / ED Diagnoses   Final diagnoses:  Other acute back pain  Muscle spasm of back    ED Discharge Orders        Ordered    traMADol (ULTRAM) 50 MG tablet  Every 12 hours PRN     06/26/17 1937    methocarbamol (ROBAXIN) 500 MG tablet  2 times daily     06/26/17 1937       Alveria ApleyCaccavale, Heavenly Christine, PA-C 06/27/17 0125    Linwood DibblesKnapp, Jon, MD 06/28/17 1438

## 2018-01-20 ENCOUNTER — Encounter (HOSPITAL_COMMUNITY): Payer: Self-pay

## 2018-01-20 ENCOUNTER — Emergency Department (HOSPITAL_COMMUNITY)
Admission: EM | Admit: 2018-01-20 | Discharge: 2018-01-20 | Disposition: A | Discharge status: Home / Self Care | Payer: Self-pay | Attending: Emergency Medicine | Admitting: Emergency Medicine

## 2018-01-20 ENCOUNTER — Ambulatory Visit (HOSPITAL_COMMUNITY)
Admission: EM | Admit: 2018-01-20 | Discharge: 2018-01-20 | Disposition: A | Discharge status: Home / Self Care | Payer: Medicaid Other | Attending: Family Medicine | Admitting: Family Medicine

## 2018-01-20 ENCOUNTER — Emergency Department (HOSPITAL_COMMUNITY): Payer: Self-pay

## 2018-01-20 ENCOUNTER — Other Ambulatory Visit: Payer: Self-pay

## 2018-01-20 DIAGNOSIS — J45909 Unspecified asthma, uncomplicated: Secondary | ICD-10-CM | POA: Insufficient documentation

## 2018-01-20 DIAGNOSIS — R1031 Right lower quadrant pain: Secondary | ICD-10-CM | POA: Insufficient documentation

## 2018-01-20 DIAGNOSIS — R109 Unspecified abdominal pain: Secondary | ICD-10-CM

## 2018-01-20 DIAGNOSIS — Z79899 Other long term (current) drug therapy: Secondary | ICD-10-CM | POA: Insufficient documentation

## 2018-01-20 DIAGNOSIS — F17210 Nicotine dependence, cigarettes, uncomplicated: Secondary | ICD-10-CM | POA: Insufficient documentation

## 2018-01-20 LAB — CBC
HCT: 42.7 % (ref 39.0–52.0)
HEMOGLOBIN: 14.3 g/dL (ref 13.0–17.0)
MCH: 30.4 pg (ref 26.0–34.0)
MCHC: 33.5 g/dL (ref 30.0–36.0)
MCV: 90.7 fL (ref 80.0–100.0)
Platelets: 146 10*3/uL — ABNORMAL LOW (ref 150–400)
RBC: 4.71 MIL/uL (ref 4.22–5.81)
RDW: 12 % (ref 11.5–15.5)
WBC: 7 10*3/uL (ref 4.0–10.5)
nRBC: 0 % (ref 0.0–0.2)

## 2018-01-20 LAB — URINALYSIS, ROUTINE W REFLEX MICROSCOPIC
Bilirubin Urine: NEGATIVE
Glucose, UA: NEGATIVE mg/dL
HGB URINE DIPSTICK: NEGATIVE
Ketones, ur: NEGATIVE mg/dL
LEUKOCYTES UA: NEGATIVE
NITRITE: NEGATIVE
Protein, ur: NEGATIVE mg/dL
SPECIFIC GRAVITY, URINE: 1.03 (ref 1.005–1.030)
pH: 8 (ref 5.0–8.0)

## 2018-01-20 LAB — COMPREHENSIVE METABOLIC PANEL
ALBUMIN: 4 g/dL (ref 3.5–5.0)
ALT: 13 U/L (ref 0–44)
ANION GAP: 7 (ref 5–15)
AST: 16 U/L (ref 15–41)
Alkaline Phosphatase: 45 U/L (ref 38–126)
BILIRUBIN TOTAL: 0.7 mg/dL (ref 0.3–1.2)
BUN: 8 mg/dL (ref 6–20)
CO2: 23 mmol/L (ref 22–32)
Calcium: 9 mg/dL (ref 8.9–10.3)
Chloride: 108 mmol/L (ref 98–111)
Creatinine, Ser: 0.93 mg/dL (ref 0.61–1.24)
GFR calc Af Amer: >60 mL/min (ref >60)
GFR calc non Af Amer: >60 mL/min (ref >60)
GLUCOSE: 104 mg/dL — AB (ref 70–99)
Potassium: 4 mmol/L (ref 3.5–5.1)
SODIUM: 138 mmol/L (ref 135–145)
TOTAL PROTEIN: 6.6 g/dL (ref 6.5–8.1)

## 2018-01-20 LAB — LIPASE, BLOOD: Lipase: 29 U/L (ref 11–51)

## 2018-01-20 MED ORDER — ONDANSETRON HCL 4 MG PO TABS: 4.0000 mg | ORAL_TABLET | Freq: Three times a day (TID) | ORAL | 0 refills | Status: DC | PRN

## 2018-01-20 MED ORDER — KETOROLAC TROMETHAMINE 15 MG/ML IJ SOLN
15.0000 mg | Freq: Once | INTRAMUSCULAR | Status: AC
  Administered 2018-01-20: 15 mg via INTRAVENOUS
  Filled 2018-01-20: qty 1

## 2018-01-20 MED ORDER — IOHEXOL 300 MG/ML  SOLN
100.0000 mL | Freq: Once | INTRAMUSCULAR | Status: AC | PRN
  Administered 2018-01-20: 100 mL via INTRAVENOUS

## 2018-01-20 MED ORDER — TETRACAINE HCL 0.5 % OP SOLN
OPHTHALMIC | Status: AC
  Filled 2018-01-20: qty 4

## 2018-01-20 MED ORDER — FLUORESCEIN SODIUM 1 MG OP STRP
ORAL_STRIP | OPHTHALMIC | Status: AC
  Filled 2018-01-20: qty 1

## 2018-01-20 MED ORDER — ONDANSETRON HCL 4 MG/2ML IJ SOLN
4.0000 mg | Freq: Once | INTRAMUSCULAR | Status: AC
  Administered 2018-01-20: 4 mg via INTRAVENOUS
  Filled 2018-01-20: qty 2

## 2018-01-20 MED ORDER — SODIUM CHLORIDE 0.9 % IV BOLUS
1000.0000 mL | Freq: Once | INTRAVENOUS | Status: AC
  Administered 2018-01-20: 1000 mL via INTRAVENOUS

## 2018-01-20 NOTE — ED Provider Notes (Signed)
[facility]    CSN: [id number] Arrival date & time: 01/20/18  9242     History   Chief Complaint Chief Complaint  Patient presents with  . Abdominal Pain    HPI Casey Gallagher is a 28 y.o. male.   Patient is a 28 year old male that presents with persistent right lower quadrant pain x3 days.  His symptoms have been waxing and waning but worsening this morning.  He has been having regular bowel movements.  He took Pepto-Bismol with some relief until approximately 3 AM this morning.  Reports some darkened stools.  He did have some slight nausea and dizziness with urinary frequency.  Denies any dysuria, hematuria.  Denies any history of bowel issues.  No diarrhea or fevers.  He did take Tylenol also without much relief.  He denies any recent increase in NSAID use.  No recent traveling.  .ROS per HPI      Past Medical History:  Diagnosis Date  . Asthma   . Emphysema     There are no active problems to display for this patient.   Past Surgical History:  Procedure Laterality Date  . TONSILLECTOMY         Home Medications    Prior to Admission medications   Medication Sig Start Date End Date Taking? Authorizing Provider  albuterol (PROVENTIL HFA;VENTOLIN HFA) 108 (90 Base) MCG/ACT inhaler Inhale 2 puffs into the lungs every 6 (six) hours as needed for wheezing or shortness of breath.    [provider]  amoxicillin-clavulanate (AUGMENTIN) 875-125 MG tablet Take 1 tablet by mouth every 12 (twelve) hours. Patient not taking: Reported on 01/20/2018 03/13/17   [name], MD  methocarbamol (ROBAXIN) 500 MG tablet Take 1 tablet (500 mg total) by mouth 2 (two) times daily. Patient not taking: Reported on 01/20/2018 06/26/17   Caccavale, Sophia, PA-C  tiotropium (SPIRIVA) 18 MCG inhalation capsule Place 18 mcg into inhaler and inhale daily.    [provider]  traMADol (ULTRAM) 50 MG tablet Take 1 tablet (50 mg total) by mouth every 12 (twelve) hours as  needed for severe pain. Patient not taking: Reported on 01/20/2018 06/26/17   Caccavale, Jeanette Caprice, PA-C    Family History Family History  Problem Relation Age of Onset  . Healthy Mother   . Healthy Father     Social History Social History   Tobacco Use  . Smoking status: Current Every Day Smoker    Packs/day: 0.50    Years: 13.00    Pack years: 6.50    Types: Cigarettes  . Smokeless tobacco: Never Used  Substance Use Topics  . Alcohol use: No  . Drug use: No     Allergies   Patient has no known allergies.   Review of Systems Review of Systems   Physical Exam Triage Vital Signs ED Triage Vitals  Enc Vitals Group     BP --      Pulse Rate 01/20/18 1009 80     Resp 01/20/18 1009 18     Temp 01/20/18 1009 98.2 F (36.8 C)     Temp Source 01/20/18 1009 Oral     SpO2 01/20/18 1009 100 %     Weight 01/20/18 1012 110 lb (49.9 kg)     Height --      Head Circumference --      Peak Flow --      Pain Score 01/20/18 1011 6     Pain Loc --  Pain Edu? --      Excl. in GC? --    No data found.  Updated Vital Signs Pulse 80   Temp 98.2 F (36.8 C) (Oral)   Resp 18   Wt 110 lb (49.9 kg)   SpO2 100%   Visual Acuity Right Eye Distance:   Left Eye Distance:   Bilateral Distance:    Right Eye Near:   Left Eye Near:    Bilateral Near:     Physical Exam Vitals signs and nursing note reviewed.  Constitutional:      Comments: Appears in pain   HENT:     Head: Normocephalic and atraumatic.  Pulmonary:     Effort: Pulmonary effort is normal.  Abdominal:     General: Abdomen is flat. Bowel sounds are normal.     Palpations: Abdomen is soft.     Tenderness: There is abdominal tenderness. There is rebound. Positive signs include psoas sign.     Hernia: No hernia is present.  Skin:    General: Skin is warm and dry.  Neurological:     Mental Status: He is alert.  Psychiatric:        Mood and Affect: Mood normal.      UC Treatments / Results   Labs (all labs ordered are listed, but only abnormal results are displayed) Labs Reviewed - No data to display  EKG None  Radiology No results found.  Procedures Procedures (including critical care time)  Medications Ordered in UC Medications - No data to display  Initial Impression / Assessment and Plan / UC Course  I have reviewed the triage vital signs and the nursing notes.  Pertinent labs & imaging results that were available during my care of the patient were reviewed by me and considered in my medical decision making (see chart for details).     Patient is a 28 year old male with 3 days of right lower quadrant tenderness.  Positive psoas sign and rebound tenderness during exam. Will send to the ER for CT scan of the abdomen to rule out appendicitis Patient understanding and agreeable to plan Final Clinical Impressions(s) / UC Diagnoses   Final diagnoses:  Right lower quadrant abdominal pain     Discharge Instructions     Please go to the ER for further evaluation of your abdominal pain    ED Prescriptions    None     Controlled Substance Prescriptions  Controlled Substance Registry consulted? Not Applicable   Janace Aris, NP 01/20/18 [phone]

## 2018-01-20 NOTE — ED Triage Notes (Signed)
Pt cc stomach pain serve to the pt. This has been going on for  3 days. Pt states  He has been having bowel movement that are very dark in color . Pt states sit looks black in color  to him.

## 2018-01-20 NOTE — Discharge Instructions (Addendum)
Please go to the ER for further evaluation of your abdominal pain

## 2018-01-20 NOTE — ED Notes (Signed)
Returned from CT and patient attempting to provide a urine sample.

## 2018-01-20 NOTE — ED Notes (Signed)
Pt given ginger ale. Pt tolerated well.

## 2018-01-20 NOTE — ED Triage Notes (Signed)
Pt endorses RLQ pain with nausea x 2 days. Sent form [facility] for appy rule out. LBM today and it was normal.

## 2018-01-20 NOTE — ED Provider Notes (Signed)
[facility]   CSN: [id number] Arrival date & time: 01/20/18  1042     History   Chief Complaint Chief Complaint  Patient presents with  . Abdominal Pain    HPI Casey Gallagher is a 28 y.o. male.  HPI   Patient is a 28 year old male with history of asthma who presents the emergency department today complaining of right lower quadrant abdominal pain that began 2 days ago.  Pain is constant in nature.  Currently rated 5/10.  Feels like a dull ache.  States pain radiates to his back.  He also endorses nausea.  Has not vomited.  Denies fevers or chills.  No constipation or diarrhea.  Denies any urinary symptoms.  Has tried taking Pepto-Bismol at home with no significant relief.  Also attempted to use Tylenol without relief.  He was seen in urgent care prior to arrival and sent here for evaluation of possible appendicitis.  Past Medical History:  Diagnosis Date  . Asthma   . Emphysema     There are no active problems to display for this patient.   Past Surgical History:  Procedure Laterality Date  . TONSILLECTOMY          Home Medications    Prior to Admission medications   Medication Sig Start Date End Date Taking? Authorizing Provider  albuterol (PROVENTIL HFA;VENTOLIN HFA) 108 (90 Base) MCG/ACT inhaler Inhale 2 puffs into the lungs every 6 (six) hours as needed for wheezing or shortness of breath.   Yes [provider]  tiotropium (SPIRIVA) 18 MCG inhalation capsule Place 18 mcg into inhaler and inhale daily.   Yes [provider]  amoxicillin-clavulanate (AUGMENTIN) 875-125 MG tablet Take 1 tablet by mouth every 12 (twelve) hours. Patient not taking: Reported on 01/20/2018 03/13/17   Mardella LaymanHagler, Brian, MD  methocarbamol (ROBAXIN) 500 MG tablet Take 1 tablet (500 mg total) by mouth 2 (two) times daily. Patient not taking: Reported on 01/20/2018 06/26/17   Caccavale, Sophia, PA-C  ondansetron (ZOFRAN) 4 MG tablet  Take 1 tablet (4 mg total) by mouth every 8 (eight) hours as needed for nausea or vomiting. 01/20/18   Heston Widener S, PA-C  traMADol (ULTRAM) 50 MG tablet Take 1 tablet (50 mg total) by mouth every 12 (twelve) hours as needed for severe pain. Patient not taking: Reported on 01/20/2018 06/26/17   Caccavale, Jeanette CapriceSophia, PA-C    Family History Family History  Problem Relation Age of Onset  . Healthy Mother   . Healthy Father     Social History Social History   Tobacco Use  . Smoking status: Current Every Day Smoker    Packs/day: 0.50    Years: 13.00    Pack years: 6.50    Types: Cigarettes  . Smokeless tobacco: Never Used  Substance Use Topics  . Alcohol use: No  . Drug use: No     Allergies   Patient has no known allergies.   Review of Systems Review of Systems  Constitutional: Negative for fever.  HENT: Negative for ear pain and sore throat.   Eyes: Negative for visual disturbance.  Respiratory: Negative for cough and shortness of breath.   Cardiovascular: Negative for chest pain.  Gastrointestinal: Positive for abdominal pain and nausea. Negative for blood in stool, constipation, diarrhea and vomiting.  Genitourinary: Positive for frequency. Negative for dysuria, hematuria and urgency.  Musculoskeletal: Negative for arthralgias and back pain.  Skin: Negative for rash.  Neurological: Negative for headaches.  All  other systems reviewed and are negative.   Physical Exam Updated Vital Signs BP 137/75 (BP Location: Right Arm)   Pulse 78   Temp 97.9 F (36.6 C) (Oral)   Resp 16   SpO2 100%   Physical Exam Vitals signs and nursing note reviewed.  Constitutional:      Appearance: He is well-developed. He is not ill-appearing or toxic-appearing.  HENT:     Head: Normocephalic and atraumatic.  Eyes:     Conjunctiva/sclera: Conjunctivae normal.  Neck:     Musculoskeletal: Neck supple.  Cardiovascular:     Rate and Rhythm: Normal rate and regular rhythm.     Heart  sounds: Normal heart sounds. No murmur.  Pulmonary:     Effort: Pulmonary effort is normal. No respiratory distress.     Breath sounds: Normal breath sounds. No stridor. No wheezing or rhonchi.  Abdominal:     General: Bowel sounds are normal.     Palpations: Abdomen is soft.     Tenderness: There is abdominal tenderness in the right lower quadrant. There is rebound. There is no right CVA tenderness, left CVA tenderness or guarding.  Skin:    General: Skin is warm and dry.  Neurological:     Mental Status: He is alert.      ED Treatments / Results  Labs (all labs ordered are listed, but only abnormal results are displayed) Labs Reviewed  COMPREHENSIVE METABOLIC PANEL - Abnormal; Notable for the following components:      Result Value   Glucose, Bld 104 (*)    All other components within normal limits  CBC - Abnormal; Notable for the following components:   Platelets 146 (*)    All other components within normal limits  URINALYSIS, ROUTINE W REFLEX MICROSCOPIC - Abnormal; Notable for the following components:   Color, Urine STRAW (*)    All other components within normal limits  LIPASE, BLOOD    EKG None  Radiology Ct Abdomen Pelvis W Contrast  Result Date: 01/20/2018 CLINICAL DATA:  Acute generalized abdominal pain. EXAM: CT ABDOMEN AND PELVIS WITH CONTRAST TECHNIQUE: Multidetector CT imaging of the abdomen and pelvis was performed using the standard protocol following bolus administration of intravenous contrast. CONTRAST:  OMNIPAQUE IOHEXOL 300 MG/ML  SOLN COMPARISON:  CT scan of Jun 02, 2016. FINDINGS: Lower chest: No acute abnormality. Hepatobiliary: No focal liver abnormality is seen. No gallstones, gallbladder wall thickening, or biliary dilatation. Pancreas: Unremarkable. No pancreatic ductal dilatation or surrounding inflammatory changes. Spleen: Normal in size without focal abnormality. Adrenals/Urinary Tract: Adrenal glands are unremarkable. Kidneys are normal,  without renal calculi, focal lesion, or hydronephrosis. Bladder is unremarkable. Stomach/Bowel: Stomach is within normal limits. The appendix is not clearly visualized, but no inflammation is seen in the right lower quadrant. No evidence of bowel wall thickening, distention, or inflammatory changes. Vascular/Lymphatic: No significant vascular findings are present. No enlarged abdominal or pelvic lymph nodes. Reproductive: Prostate is unremarkable. Other: No abdominal wall hernia or abnormality. No abdominopelvic ascites. Musculoskeletal: No acute or significant osseous findings. IMPRESSION: No definite abnormality is seen in the abdomen or pelvis. The appendix is not definitively identified, but no definite inflammation is noted in the right lower quadrant. Electronically Signed   By: Lupita Raider, M.D.   On: 01/20/2018 13:52    Procedures Procedures (including critical care time)  Medications Ordered in ED Medications  sodium chloride 0.9 % bolus 1,000 mL (1,000 mLs Intravenous New Bag/Given 01/20/18 1346)  ondansetron (ZOFRAN) injection 4  mg (4 mg Intravenous Given 01/20/18 1346)  ketorolac (TORADOL) 15 MG/ML injection 15 mg (15 mg Intravenous Given 01/20/18 1348)  iohexol (OMNIPAQUE) 300 MG/ML solution 100 mL (100 mLs Intravenous Contrast Given 01/20/18 1320)     Initial Impression / Assessment and Plan / ED Course  I have reviewed the triage vital signs and the nursing notes.  Pertinent labs & imaging results that were available during my care of the patient were reviewed by me and considered in my medical decision making (see chart for details).     Final Clinical Impressions(s) / ED Diagnoses   Final diagnoses:  Abdominal pain, unspecified abdominal location   Pt with RLQ abd pain x3 days. Afebrile. Normal vitals.   RLQ TTP on exam with rebound ttp present initially.   CBC without leukocytosis. Thrombocytopenia present.  CMP WNL. Lipase negative UA without evidence of UTI  Ct abd  pelvis with no acute findings to suggest appendicitis. Appendix not definitively visualized.   On re-evaluation pt feeling improved after toradol. Repeat abd exam is benign without signs of surgical abdomen.  He was able to tolerate p.o. in the ED.  Have discussed the findings with the patient at that there was no definite appendicitis noted on his CT scan.  Have advised him to monitor his symptoms for the next 24 to 48 hours and if he has any recurrence or worsening of his symptoms then he should return to the ER for reevaluation.  He voices understanding of the plan and reasons to return to the ED.  All questions answered.  ED Discharge Orders         Ordered    ondansetron (ZOFRAN) 4 MG tablet  Every 8 hours PRN     01/20/18 1549           Jaleigh Mccroskey S, PA-C 01/20/18 1604    Loren RacerYelverton, David, MD 01/21/18 [phone]

## 2018-01-20 NOTE — Discharge Instructions (Addendum)
Please follow up with your primary doctor within the next 5-7 days.  Please return to the ER sooner if you have any new or worsening symptoms, or if you have any of the following symptoms: ° °Abdominal pain that does not go away.  °You have a fever.  °You keep throwing up (vomiting).  °The pain is felt only in portions of the abdomen. Pain in the right side could possibly be appendicitis. In an adult, pain in the left lower portion of the abdomen could be colitis or diverticulitis.  °You pass bloody or black tarry stools.  °There is bright red blood in the stool.  °The constipation stays for more than 4 days.  °There is belly (abdominal) or rectal pain.  °You do not seem to be getting better.  °You have any questions or concerns.  ° °

## 2018-06-28 ENCOUNTER — Encounter (HOSPITAL_COMMUNITY): Payer: Self-pay

## 2018-06-28 ENCOUNTER — Other Ambulatory Visit: Payer: Self-pay

## 2018-06-28 ENCOUNTER — Ambulatory Visit (HOSPITAL_COMMUNITY)
Admission: EM | Admit: 2018-06-28 | Discharge: 2018-06-28 | Disposition: A | Discharge status: Home / Self Care | Payer: Medicaid Other | Attending: Family Medicine | Admitting: Family Medicine

## 2018-06-28 DIAGNOSIS — R109 Unspecified abdominal pain: Secondary | ICD-10-CM

## 2018-06-28 LAB — POCT URINALYSIS DIP (DEVICE)
Bilirubin Urine: NEGATIVE
Glucose, UA: NEGATIVE mg/dL
Hgb urine dipstick: NEGATIVE
Ketones, ur: NEGATIVE mg/dL
Leukocytes,Ua: NEGATIVE
Nitrite: NEGATIVE
Protein, ur: NEGATIVE mg/dL
Specific Gravity, Urine: 1.02 (ref 1.005–1.030)
Urobilinogen, UA: 0.2 mg/dL (ref 0.0–1.0)
pH: 6 (ref 5.0–8.0)

## 2018-06-28 MED ORDER — NAPROXEN 500 MG PO TABS: 500.0000 mg | ORAL_TABLET | Freq: Two times a day (BID) | ORAL | 0 refills | Status: DC

## 2018-06-28 MED ORDER — CYCLOBENZAPRINE HCL 10 MG PO TABS: 10.0000 mg | ORAL_TABLET | Freq: Two times a day (BID) | ORAL | 0 refills | Status: DC | PRN

## 2018-06-28 NOTE — Discharge Instructions (Addendum)
Your urine sample was clear without infection or blood I believe this is a pulled muscle You can take flexeril for muscle relaxant. Be aware this will make you drowsy.  Naproxen for pain and inflammation.  Rest, Ice For worse or continued symptoms please go to the ER

## 2018-06-28 NOTE — ED Triage Notes (Signed)
Pt states he has a sharp and aching pain in his left side this started this morning. Pt states it feels like a pressure on that side.

## 2018-06-29 NOTE — ED Provider Notes (Signed)
[place]    CSN: [id number] Arrival date & time: 06/28/18  1425     History   Chief Complaint Chief Complaint  Patient presents with  . Flank Pain    HPI Casey Gallagher is a 28 y.o. male.   Patient is a 28 year old male the presents today with left flank pain.  This started this a.m. after leaving work.  Reporting feels like a ache and pressure.  The pain is worse with movement and deep breathing.  Denies any falls or injuries.  Denies any heavy lifting.  No chest pain, shortness of breath or fevers.  No cough.  Symptoms have been constant.  He took ibuprofen with mild relief.  He is a current everyday smoker  ROS per HPI      Past Medical History:  Diagnosis Date  . Asthma   . Emphysema     There are no active problems to display for this patient.   Past Surgical History:  Procedure Laterality Date  . TONSILLECTOMY         Home Medications    Prior to Admission medications   Medication Sig Start Date End Date Taking? Authorizing Provider  albuterol (PROVENTIL HFA;VENTOLIN HFA) 108 (90 Base) MCG/ACT inhaler Inhale 2 puffs into the lungs every 6 (six) hours as needed for wheezing or shortness of breath.    [provider]  cyclobenzaprine (FLEXERIL) 10 MG tablet Take 1 tablet (10 mg total) by mouth 2 (two) times daily as needed for muscle spasms. 06/28/18   Loura Halt A, NP  naproxen (NAPROSYN) 500 MG tablet Take 1 tablet (500 mg total) by mouth 2 (two) times daily. 06/28/18   Loura Halt A, NP  tiotropium (SPIRIVA) 18 MCG inhalation capsule Place 18 mcg into inhaler and inhale daily.    [provider]    Family History Family History  Problem Relation Age of Onset  . Bipolar disorder Mother   . Heart failure Mother   . Schizophrenia Mother   . Heart failure Father   . Schizophrenia Father   . Bipolar disorder Father     Social History Social History   Tobacco Use  . Smoking status: Current Every Day Smoker   Packs/day: 0.50    Years: 13.00    Pack years: 6.50    Types: Cigarettes  . Smokeless tobacco: Never Used  Substance Use Topics  . Alcohol use: No  . Drug use: No     Allergies   Patient has no known allergies.   Review of Systems Review of Systems   Physical Exam Triage Vital Signs ED Triage Vitals  Enc Vitals Group     BP 06/28/18 1454 117/69     Pulse Rate 06/28/18 1454 65     Resp 06/28/18 1454 16     Temp 06/28/18 1454 98.3 F (36.8 C)     Temp Source 06/28/18 1505 Oral     SpO2 06/28/18 1454 100 %     Weight 06/28/18 1456 110 lb (49.9 kg)     Height --      Head Circumference --      Peak Flow --      Pain Score 06/28/18 1456 10     Pain Loc --      Pain Edu? --      Excl. in [place]? --    No data found.  Updated Vital Signs BP 110/68 (BP Location: Left Arm)   Pulse (!) 53 Comment: Nurse Notified of critical  vital signs. SP  Temp 98.3 F (36.8 C) (Oral)   Resp 14   Wt 110 lb (49.9 kg)   SpO2 100%   Visual Acuity Right Eye Distance:   Left Eye Distance:   Bilateral Distance:    Right Eye Near:   Left Eye Near:    Bilateral Near:     Physical Exam Vitals signs and nursing note reviewed.  Constitutional:      General: He is not in acute distress.    Appearance: Normal appearance. He is not ill-appearing, toxic-appearing or diaphoretic.  HENT:     Head: Normocephalic and atraumatic.     Nose: Nose normal.  Eyes:     Conjunctiva/sclera: Conjunctivae normal.  Neck:     Musculoskeletal: Normal range of motion.  Cardiovascular:     Rate and Rhythm: Normal rate and regular rhythm.     Pulses: Normal pulses.     Heart sounds: Normal heart sounds.  Pulmonary:     Effort: Pulmonary effort is normal.     Breath sounds: Normal breath sounds.  Musculoskeletal: Normal range of motion.     Right shoulder: He exhibits tenderness.       Arms:  Skin:    General: Skin is warm and dry.     Findings: No rash.  Neurological:     Mental Status: He is  alert.      UC Treatments / Results  Labs (all labs ordered are listed, but only abnormal results are displayed) Labs Reviewed  POCT URINALYSIS DIP (DEVICE)    EKG None  Radiology No results found.  Procedures Procedures (including critical care time)  Medications Ordered in UC Medications - No data to display  Initial Impression / Assessment and Plan / UC Course  I have reviewed the triage vital signs and the nursing notes.  Pertinent labs & imaging results that were available during my care of the patient were reviewed by me and considered in my medical decision making (see chart for details).     Symptoms consistent with musculoskeletal pain. Lungs clear breath sounds equal. Urine negative for infection and without blood.  Not likely kidney stone. Will prescribe Flexeril for muscle relaxants and naproxen for pain inflammation Rest, ice Follow up as needed for continued or worsening symptoms   Final Clinical Impressions(s) / UC Diagnoses   Final diagnoses:  Flank pain     Discharge Instructions     Your urine sample was clear without infection or blood I believe this is a pulled muscle You can take flexeril for muscle relaxant. Be aware this will make you drowsy.  Naproxen for pain and inflammation.  Rest, Ice For worse or continued symptoms please go to the ER    ED Prescriptions    Medication Sig Dispense Auth. Provider   cyclobenzaprine (FLEXERIL) 10 MG tablet Take 1 tablet (10 mg total) by mouth 2 (two) times daily as needed for muscle spasms. 20 tablet Aaira Oestreicher A, NP   naproxen (NAPROSYN) 500 MG tablet Take 1 tablet (500 mg total) by mouth 2 (two) times daily. 30 tablet Dahlia ByesBast, Vinal Rosengrant A, NP     Controlled Substance Prescriptions [place] Controlled Substance Registry consulted? Not Applicable   Janace ArisBast, Camdan Burdi A, NP 06/29/18 [phone]

## 2018-12-12 ENCOUNTER — Encounter (HOSPITAL_COMMUNITY): Payer: Self-pay

## 2018-12-12 ENCOUNTER — Other Ambulatory Visit: Payer: Self-pay

## 2018-12-12 ENCOUNTER — Ambulatory Visit (HOSPITAL_COMMUNITY)
Admission: EM | Admit: 2018-12-12 | Discharge: 2018-12-12 | Disposition: A | Discharge status: Home / Self Care | Payer: Medicaid Other | Attending: Family Medicine | Admitting: Family Medicine

## 2018-12-12 DIAGNOSIS — H8110 Benign paroxysmal vertigo, unspecified ear: Secondary | ICD-10-CM

## 2018-12-12 DIAGNOSIS — H6123 Impacted cerumen, bilateral: Secondary | ICD-10-CM

## 2018-12-12 MED ORDER — MECLIZINE HCL 12.5 MG PO TABS: 12.5000 mg | ORAL_TABLET | Freq: Three times a day (TID) | ORAL | 0 refills | Status: DC | PRN

## 2018-12-12 NOTE — ED Triage Notes (Signed)
Patient presents to Urgent Care with complaints of sudden onset of dizziness since 10:30 last night. 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 Pt reports it continued after he arrived home, and it has continued into today, has not had any water today.

## 2018-12-12 NOTE — ED Provider Notes (Signed)
[facility]    CSN: [id number] Arrival date & time: 12/12/18  1148      History   Chief Complaint Chief Complaint  Patient presents with  . Dizziness    HPI Casey Gallagher is a 28 y.o. male.   [facility] patient presents with dizziness x 15 hours.  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  He continued to work through his shift, came home, and expected the dizziness to clear.  Nevertheless it is persisted.  It is made worse by looking down or moving his head sideways.  Patient has no headache, fever, prior history of vertigo, ear pain, tinnitus, hearing loss.     Past Medical History:  Diagnosis Date  . Asthma   . Emphysema     There are no active problems to display for this patient.   Past Surgical History:  Procedure Laterality Date  . TONSILLECTOMY         Home Medications    Prior to Admission medications   Medication Sig Start Date End Date Taking? Authorizing Provider  albuterol (PROVENTIL HFA;VENTOLIN HFA) 108 (90 Base) MCG/ACT inhaler Inhale 2 puffs into the lungs every 6 (six) hours as needed for wheezing or shortness of breath.    [provider]  meclizine (ANTIVERT) 12.5 MG tablet Take 1 tablet (12.5 mg total) by mouth 3 (three) times daily as needed for dizziness. 12/12/18   Elvina Sidle, MD  tiotropium (SPIRIVA) 18 MCG inhalation capsule Place 18 mcg into inhaler and inhale daily.    [provider]    Family History Family History  Problem Relation Age of Onset  . Bipolar disorder Mother   . Heart failure Mother   . Schizophrenia Mother   . Heart failure Father   . Schizophrenia Father   . Bipolar disorder Father     Social History Social History   Tobacco Use  . Smoking status: Current Every Day Smoker    Packs/day: 0.50    Years: 13.00    Pack years: 6.50    Types: Cigarettes  . Smokeless tobacco: Never Used  Substance Use Topics  . Alcohol use: Yes   Comment: very rare  . Drug use: No     Allergies   Patient has no known allergies.   Review of Systems Review of Systems   Physical Exam Triage Vital Signs ED Triage Vitals  Enc Vitals Group     BP 12/12/18 1212 (!) 140/98     Pulse Rate 12/12/18 1212 74     Resp 12/12/18 1212 16     Temp 12/12/18 1212 98.2 F (36.8 C)     Temp Source 12/12/18 1212 Oral     SpO2 12/12/18 1212 100 %     Weight --      Height --      Head Circumference --      Peak Flow --      Pain Score 12/12/18 1211 0     Pain Loc --      Pain Edu? --      Excl. in [place]? --    No data found.  Updated Vital Signs BP (!) 140/98 (BP Location: Right Arm)   Pulse 74   Temp 98.2 F (36.8 C) (Oral)   Resp 16   SpO2 100%   Physical Exam Vitals signs and nursing note reviewed.  Constitutional:      Appearance: Normal appearance. He is normal weight.  HENT:  Right Ear: There is impacted cerumen.     Left Ear: There is impacted cerumen.  Eyes:     Extraocular Movements: Extraocular movements intact.     Conjunctiva/sclera: Conjunctivae normal.     Pupils: Pupils are equal, round, and reactive to light.  Neck:     Musculoskeletal: Normal range of motion and neck supple.     Vascular: No carotid bruit.  Cardiovascular:     Rate and Rhythm: Normal rate and regular rhythm.  Pulmonary:     Effort: Pulmonary effort is normal.     Breath sounds: Normal breath sounds.  Musculoskeletal: Normal range of motion.  Skin:    General: Skin is warm and dry.  Neurological:     General: No focal deficit present.     Mental Status: He is alert and oriented to person, place, and time.  Psychiatric:        Mood and Affect: Mood normal.        Thought Content: Thought content normal.      UC Treatments / Results  Labs (all labs ordered are listed, but only abnormal results are displayed) Labs Reviewed - No data to display  EKG   Radiology No results found.  Procedures Ear Cerumen Removal   Date/Time: 12/12/2018 1:15 PM Performed by: Robyn Haber, MD Authorized by: Robyn Haber, MD   Consent:    Consent obtained:  Verbal   Consent given by:  Patient   Risks discussed:  Pain and incomplete removal   Alternatives discussed:  No treatment Procedure details:    Location:  L ear and R ear   Procedure type: irrigation   Post-procedure details:    Inspection:  TM intact   Hearing quality:  Normal   Patient tolerance of procedure:  Tolerated well, no immediate complications Comments:     Each ear lavaged clear by physician   (including critical care time)  Medications Ordered in UC Medications - No data to display  Initial Impression / Assessment and Plan / UC Course  I have reviewed the triage vital signs and the nursing notes.  Pertinent labs & imaging results that were available during my care of the patient were reviewed by me and considered in my medical decision making (see chart for details).    Final Clinical Impressions(s) / UC Diagnoses   Final diagnoses:  Benign paroxysmal positional vertigo, unspecified laterality  Bilateral impacted cerumen   Discharge Instructions   None    ED Prescriptions    Medication Sig Dispense Auth. Provider   meclizine (ANTIVERT) 12.5 MG tablet Take 1 tablet (12.5 mg total) by mouth 3 (three) times daily as needed for dizziness. 30 tablet Robyn Haber, MD     I have reviewed the PDMP during this encounter.   Robyn Haber, MD 12/12/18 1315

## 2018-12-28 ENCOUNTER — Encounter (HOSPITAL_COMMUNITY): Payer: Self-pay

## 2018-12-28 ENCOUNTER — Ambulatory Visit (HOSPITAL_COMMUNITY)
Admission: EM | Admit: 2018-12-28 | Discharge: 2018-12-28 | Disposition: A | Discharge status: Home / Self Care | Payer: Medicaid Other | Attending: Urgent Care | Admitting: Urgent Care

## 2018-12-28 ENCOUNTER — Other Ambulatory Visit: Payer: Self-pay

## 2018-12-28 DIAGNOSIS — J438 Other emphysema: Secondary | ICD-10-CM | POA: Diagnosis not present

## 2018-12-28 DIAGNOSIS — J4540 Moderate persistent asthma, uncomplicated: Secondary | ICD-10-CM | POA: Diagnosis not present

## 2018-12-28 DIAGNOSIS — B349 Viral infection, unspecified: Secondary | ICD-10-CM | POA: Insufficient documentation

## 2018-12-28 DIAGNOSIS — B338 Other specified viral diseases: Secondary | ICD-10-CM | POA: Diagnosis not present

## 2018-12-28 DIAGNOSIS — R52 Pain, unspecified: Secondary | ICD-10-CM | POA: Diagnosis present

## 2018-12-28 DIAGNOSIS — Z20828 Contact with and (suspected) exposure to other viral communicable diseases: Secondary | ICD-10-CM | POA: Diagnosis not present

## 2018-12-28 DIAGNOSIS — F17210 Nicotine dependence, cigarettes, uncomplicated: Secondary | ICD-10-CM | POA: Insufficient documentation

## 2018-12-28 DIAGNOSIS — R6883 Chills (without fever): Secondary | ICD-10-CM | POA: Insufficient documentation

## 2018-12-28 DIAGNOSIS — F17200 Nicotine dependence, unspecified, uncomplicated: Secondary | ICD-10-CM

## 2018-12-28 DIAGNOSIS — Z79899 Other long term (current) drug therapy: Secondary | ICD-10-CM | POA: Insufficient documentation

## 2018-12-28 DIAGNOSIS — R5381 Other malaise: Secondary | ICD-10-CM | POA: Insufficient documentation

## 2018-12-28 NOTE — ED Provider Notes (Signed)
[place]   MRN: [id number] DOB: 17-Apr-1990  Subjective:   Zhi Geier is a 28 y.o. male presenting for 1 day history of cute onset of moderate malaise including chills, fever, sweats.  Complete ROS below.  Has not tried medications for relief.  Denies COVID 19 contacts. Works at [employer]. Smokes less than 1/2 ppd. Has hx of emphysema, asthma.   No current facility-administered medications for this encounter.  Current Outpatient Medications:  .  albuterol (PROVENTIL HFA;VENTOLIN HFA) 108 (90 Base) MCG/ACT inhaler, Inhale 2 puffs into the lungs every 6 (six) hours as needed for wheezing or shortness of breath., Disp: , Rfl:  .  meclizine (ANTIVERT) 12.5 MG tablet, Take 1 tablet (12.5 mg total) by mouth 3 (three) times daily as needed for dizziness., Disp: 30 tablet, Rfl: 0 .  tiotropium (SPIRIVA) 18 MCG inhalation capsule, Place 18 mcg into inhaler and inhale daily., Disp: , Rfl:    No Known Allergies  Past Medical History:  Diagnosis Date  . Asthma   . Emphysema      Past Surgical History:  Procedure Laterality Date  . TONSILLECTOMY      Family History  Problem Relation Age of Onset  . Bipolar disorder Mother   . Heart failure Mother   . Schizophrenia Mother   . Heart failure Father   . Schizophrenia Father   . Bipolar disorder Father     Social History   Tobacco Use  . Smoking status: Current Every Day Smoker    Packs/day: 0.50    Years: 13.00    Pack years: 6.50    Types: Cigarettes  . Smokeless tobacco: Never Used  Substance Use Topics  . Alcohol use: Yes    Comment: very rare  . Drug use: No    Review of Systems  Constitutional: Positive for chills, diaphoresis, fever (subjective fever) and malaise/fatigue.  HENT: Negative for congestion, ear pain, sinus pain and sore throat.   Eyes: Negative for discharge and redness.  Respiratory: Negative for cough, hemoptysis, shortness of breath and wheezing.   Cardiovascular: Negative for chest  pain.  Gastrointestinal: Negative for abdominal pain, diarrhea, nausea and vomiting.  Genitourinary: Negative for dysuria, flank pain and hematuria.  Musculoskeletal: Positive for myalgias.  Skin: Negative for rash.  Neurological: Positive for headaches (mild, yesterday, now resolved). Negative for dizziness and weakness.  Psychiatric/Behavioral: Negative for depression and substance abuse.     Objective:   Vitals: BP 118/68 (BP Location: Left Arm)   Pulse (!) 57   Temp 97.8 F (36.6 C) (Oral)   Resp 16   SpO2 100%   Physical Exam Constitutional:      General: He is not in acute distress.    Appearance: Normal appearance. He is well-developed. He is not ill-appearing, toxic-appearing or diaphoretic.  HENT:     Head: Normocephalic and atraumatic.     Right Ear: External ear normal.     Left Ear: External ear normal.     Nose: Nose normal.     Mouth/Throat:     Mouth: Mucous membranes are moist.     Pharynx: Oropharynx is clear.  Eyes:     General: No scleral icterus.    Extraocular Movements: Extraocular movements intact.     Pupils: Pupils are equal, round, and reactive to light.  Cardiovascular:     Rate and Rhythm: Normal rate and regular rhythm.     Heart sounds: Normal heart sounds. No murmur. No friction rub. No gallop.  Pulmonary:     Effort: Pulmonary effort is normal. No respiratory distress.     Breath sounds: Normal breath sounds. No stridor. No wheezing, rhonchi or rales.  Neurological:     Mental Status: He is alert and oriented to person, place, and time.  Psychiatric:        Mood and Affect: Mood normal.        Speech: Speech normal.        Behavior: Behavior normal.        Thought Content: Thought content normal.      Assessment and Plan :   1. Viral syndrome   2. Body aches   3. Chills   4. Sweating fever   5. Malaise   6. Other emphysema (HCC)   7. Moderate persistent asthma without complication     Will manage for viral illness such  as viral URI, viral rhinitis, possible COVID-19. Counseled patient on nature of COVID-19 including modes of transmission, diagnostic testing, management and supportive care.  Offered symptomatic relief. COVID 19 testing is pending.  Patient declined refill of albuterol, states that he has enough of his inhaler and also his Spiriva.  He declined any cough medications as he is not coughing now.  Counseled patient on potential for adverse effects with medications prescribed/recommended today, ER and return-to-clinic precautions discussed, patient verbalized understanding.     Wallis Bamberg, PA-C 12/28/18 1201

## 2018-12-28 NOTE — Discharge Instructions (Addendum)
We will manage this as a viral syndrome. For sore throat or cough try using a honey-based tea. Use 3 teaspoons of honey with juice squeezed from half lemon. Place shaved pieces of ginger into 1/2-1 cup of water and warm over stove top. Then mix the ingredients and repeat every 4 hours as needed. Please take Tylenol 500mg every 6 hours. Hydrate very well with at least 2 liters of water. Eat light meals such as soups to replenish electrolytes and soft fruits, veggies. Start an antihistamine like Zyrtec, Allegra or Claritin for postnasal drainage, sinus congestion.  You can take this together with pseudoephedrine (Sudafed) at a dose of 60 mg 3 times a day or 120 mg twice daily as needed for the same kind of congestion.    

## 2018-12-28 NOTE — ED Triage Notes (Signed)
Pt presents to the UC with fever, body aches, headache, chills and nausea x 1 day.

## 2018-12-30 LAB — NOVEL CORONAVIRUS, NAA (HOSP ORDER, SEND-OUT TO REF LAB; TAT 18-24 HRS): SARS-CoV-2, NAA: NOT DETECTED

## 2019-04-10 ENCOUNTER — Encounter (HOSPITAL_COMMUNITY): Payer: Self-pay

## 2019-04-10 ENCOUNTER — Ambulatory Visit (HOSPITAL_COMMUNITY)
Admission: EM | Admit: 2019-04-10 | Discharge: 2019-04-10 | Disposition: A | Discharge status: Home / Self Care | Payer: Self-pay | Attending: Family Medicine | Admitting: Family Medicine

## 2019-04-10 ENCOUNTER — Other Ambulatory Visit: Payer: Self-pay

## 2019-04-10 DIAGNOSIS — K219 Gastro-esophageal reflux disease without esophagitis: Secondary | ICD-10-CM

## 2019-04-10 DIAGNOSIS — R1012 Left upper quadrant pain: Secondary | ICD-10-CM

## 2019-04-10 LAB — POCT URINALYSIS DIP (DEVICE)
Bilirubin Urine: NEGATIVE
Glucose, UA: NEGATIVE mg/dL
Hgb urine dipstick: NEGATIVE
Ketones, ur: NEGATIVE mg/dL
Leukocytes,Ua: NEGATIVE
Nitrite: NEGATIVE
Protein, ur: NEGATIVE mg/dL
Specific Gravity, Urine: 1.02 (ref 1.005–1.030)
Urobilinogen, UA: 1 mg/dL (ref 0.0–1.0)
pH: 8.5 — ABNORMAL HIGH (ref 5.0–8.0)

## 2019-04-10 MED ORDER — LIDOCAINE VISCOUS HCL 2 % MT SOLN
OROMUCOSAL | Status: AC
  Filled 2019-04-10: qty 15

## 2019-04-10 MED ORDER — ALUM & MAG HYDROXIDE-SIMETH 200-200-20 MG/5ML PO SUSP: 30.0000 mL | Freq: Four times a day (QID) | ORAL | 0 refills | Status: DC | PRN

## 2019-04-10 MED ORDER — OMEPRAZOLE 20 MG PO CPDR: 20.0000 mg | DELAYED_RELEASE_CAPSULE | Freq: Two times a day (BID) | ORAL | 0 refills | Status: DC

## 2019-04-10 MED ORDER — ALUM & MAG HYDROXIDE-SIMETH 200-200-20 MG/5ML PO SUSP
30.0000 mL | Freq: Once | ORAL | Status: AC
  Administered 2019-04-10: 30 mL via ORAL

## 2019-04-10 MED ORDER — LIDOCAINE VISCOUS HCL 2 % MT SOLN
15.0000 mL | Freq: Once | OROMUCOSAL | Status: AC
  Administered 2019-04-10: 15 mL via ORAL

## 2019-04-10 MED ORDER — ALUM & MAG HYDROXIDE-SIMETH 200-200-20 MG/5ML PO SUSP
ORAL | Status: AC
  Filled 2019-04-10: qty 30

## 2019-04-10 NOTE — ED Provider Notes (Signed)
Nina    CSN: [id number] Arrival date & time: 04/10/19  1702      History   Chief Complaint Chief Complaint  Patient presents with  . Abdominal Pain  . Chest Pain    HPI Casey Gallagher is a 29 y.o. male history of asthma, prior tonsillectomy presenting today for evaluation of abdominal pain.  Patient notes that he has had abdominal pain for approximately 1 to 2 weeks.  Mainly is noted in his lower abdomen, but of recently he has had worsening pain especially in his left upper quadrant.  Describes it as a pressure sensation and as if something is sitting on this area.  He also felt nausea earlier without vomiting.  Does report history of reflux, but denies current medicines.  Tried some Pepto-Bismol which did improve symptoms.  Bowels have been solid of recently, slightly smaller than normal.  No improvement with passing bowels.  Denies associated chest pain, pain slightly worsens with deep inspiration.  Denies recent coughing.  Denies alcohol use.  Does report approximately half pack per day smoking.  Denies history of hypertension, diabetes, early death from MI.  Denies leg pain or leg swelling.  Has felt some pressure in his lower abdomen.  Feels this with urination sometimes will have a sensation of incomplete voiding.  Denies dysuria or penile discharge.  Denies concerns for STDs, and monogamous relationship with wife.  HPI  Past Medical History:  Diagnosis Date  . Asthma   . Emphysema     There are no problems to display for this patient.   Past Surgical History:  Procedure Laterality Date  . TONSILLECTOMY         Home Medications    Prior to Admission medications   Medication Sig Start Date End Date Taking? Authorizing Provider  albuterol (PROVENTIL HFA;VENTOLIN HFA) 108 (90 Base) MCG/ACT inhaler Inhale 2 puffs into the lungs every 6 (six) hours as needed for wheezing or shortness of breath.    [provider]  alum & mag hydroxide-simeth  (MAALOX/MYLANTA) 200-200-20 MG/5ML suspension Take 30 mLs by mouth every 6 (six) hours as needed for indigestion or heartburn. 04/10/19   Kinzly Pierrelouis C, PA-C  meclizine (ANTIVERT) 12.5 MG tablet Take 1 tablet (12.5 mg total) by mouth 3 (three) times daily as needed for dizziness. 12/12/18   Robyn Haber, MD  omeprazole (PRILOSEC) 20 MG capsule Take 1 capsule (20 mg total) by mouth 2 (two) times daily before a meal for 14 days. 04/10/19 04/24/19  Nixon Kolton C, PA-C  tiotropium (SPIRIVA) 18 MCG inhalation capsule Place 18 mcg into inhaler and inhale daily.    [provider]    Family History Family History  Problem Relation Age of Onset  . Bipolar disorder Mother   . Heart failure Mother   . Schizophrenia Mother   . Heart failure Father   . Schizophrenia Father   . Bipolar disorder Father     Social History Social History   Tobacco Use  . Smoking status: Current Every Day Smoker    Packs/day: 0.50    Years: 13.00    Pack years: 6.50    Types: Cigarettes  . Smokeless tobacco: Never Used  Substance Use Topics  . Alcohol use: Yes    Comment: very rare  . Drug use: No     Allergies   Patient has no known allergies.   Review of Systems Review of Systems  Constitutional: Negative for fever.  HENT: Negative for sore throat.  Respiratory: Positive for shortness of breath.   Cardiovascular: Negative for chest pain.  Gastrointestinal: Positive for abdominal pain and nausea. Negative for vomiting.  Genitourinary: Negative for difficulty urinating, discharge, dysuria, frequency, penile pain, penile swelling, scrotal swelling and testicular pain.  Skin: Negative for rash.  Neurological: Negative for dizziness, light-headedness and headaches.     Physical Exam Triage Vital Signs ED Triage Vitals  Enc Vitals Group     BP 04/10/19 1715 114/61     Pulse Rate 04/10/19 1715 (!) 59     Resp 04/10/19 1715 19     Temp 04/10/19 1715 98.6 F (37 C)     Temp  Source 04/10/19 1715 Oral     SpO2 04/10/19 1715 100 %     Weight --      Height --      Head Circumference --      Peak Flow --      Pain Score 04/10/19 1713 10     Pain Loc --      Pain Edu? --      Excl. in GC? --    No data found.  Updated Vital Signs BP 114/61 (BP Location: Right Arm)   Pulse (!) 59   Temp 98.6 F (37 C) (Oral)   Resp 19   SpO2 100%   Visual Acuity Right Eye Distance:   Left Eye Distance:   Bilateral Distance:    Right Eye Near:   Left Eye Near:    Bilateral Near:     Physical Exam Vitals and nursing note reviewed.  Constitutional:      Appearance: He is well-developed.     Comments: No acute distress  HENT:     Head: Normocephalic and atraumatic.     Nose: Nose normal.  Eyes:     Conjunctiva/sclera: Conjunctivae normal.  Cardiovascular:     Rate and Rhythm: Normal rate.  Pulmonary:     Effort: Pulmonary effort is normal. No respiratory distress.     Comments: Breathing comfortably at rest, CTABL, no wheezing, rales or other adventitious sounds auscultated Abdominal:     General: There is no distension.     Comments: Soft, nondistended, tender to palpation to left upper quadrant and left lower quadrant, more focal tenderness in left upper quadrant, negative rebound, negative Rovsing, negative McBurney's, negative Murphy's  Musculoskeletal:        General: Normal range of motion.     Cervical back: Neck supple.  Skin:    General: Skin is warm and dry.  Neurological:     Mental Status: He is alert and oriented to person, place, and time.      UC Treatments / Results  Labs (all labs ordered are listed, but only abnormal results are displayed) Labs Reviewed  POCT URINALYSIS DIP (DEVICE) - Abnormal; Notable for the following components:      Result Value   pH 8.5 (*)    All other components within normal limits    EKG   Radiology No results found.  Procedures Procedures (including critical care time)  Medications Ordered  in UC Medications  alum & mag hydroxide-simeth (MAALOX/MYLANTA) 200-200-20 MG/5ML suspension 30 mL (30 mLs Oral Given 04/10/19 1745)    And  lidocaine (XYLOCAINE) 2 % viscous mouth solution 15 mL (15 mLs Oral Given 04/10/19 1745)    Initial Impression / Assessment and Plan / UC Course  I have reviewed the triage vital signs and the nursing notes.  Pertinent labs & imaging results  that were available during my care of the patient were reviewed by me and considered in my medical decision making (see chart for details).     Patient provided GI cocktail with significant improvement in discomfort in the left upper quadrant area.  Continues to have some lower abdominal pressure.  UA without hemoglobin, negative leuks and nitrites.  Low concern for STDs.  Will treat for reflux with Prilosec, supplement with Maalox/Pepcid, avoid trigger foods.  Pressure in lower abdomen may be from some mild constipation.  Discussed lifestyle recommendations for this along with over-the-counter meds for trial if continuing to have less frequent/harder/smaller stools.  Do not suspect abdominal emergency at this time.  Do not suspect cardiac or pulmonary etiology either.  Discussed strict return precautions. Patient verbalized understanding and is agreeable with plan.  Final Clinical Impressions(s) / UC Diagnoses   Final diagnoses:  Left upper quadrant abdominal pain  Gastroesophageal reflux disease, unspecified whether esophagitis present     Discharge Instructions     Begin omeprazole/prilosec 20 mg twice daily for the next 2 weeks Supplement with maalox or pepcid for more immediate relief in the meantime Avoid trigger foods- read attached If continuing to have lower abdominal pain/pressure may try taking miralax and colace daily, drink plenty of water and incorporate fiber in diet Follow up if not improving or worsening   ED Prescriptions    Medication Sig Dispense Auth. Provider   omeprazole (PRILOSEC)  20 MG capsule Take 1 capsule (20 mg total) by mouth 2 (two) times daily before a meal for 14 days. 28 capsule Giacomo Valone C, PA-C   alum & mag hydroxide-simeth (MAALOX/MYLANTA) 200-200-20 MG/5ML suspension Take 30 mLs by mouth every 6 (six) hours as needed for indigestion or heartburn. 355 mL Lorali Khamis, [place] C, PA-C     PDMP not reviewed this encounter.   Lew Dawes, [place] 04/10/19 1849

## 2019-04-10 NOTE — Discharge Instructions (Signed)
Begin omeprazole/prilosec 20 mg twice daily for the next 2 weeks Supplement with maalox or pepcid for more immediate relief in the meantime Avoid trigger foods- read attached If continuing to have lower abdominal pain/pressure may try taking miralax and colace daily, drink plenty of water and incorporate fiber in diet Follow up if not improving or worsening

## 2019-04-10 NOTE — ED Triage Notes (Signed)
Pt reports having abdominal pain x 1 week; chest pain started today. Pepto Bismol give some relief.

## 2019-04-12 ENCOUNTER — Ambulatory Visit (HOSPITAL_COMMUNITY)
Admission: EM | Admit: 2019-04-12 | Discharge: 2019-04-12 | Disposition: A | Discharge status: Home / Self Care | Payer: Medicaid Other | Attending: Family Medicine | Admitting: Family Medicine

## 2019-04-12 ENCOUNTER — Other Ambulatory Visit: Payer: Self-pay

## 2019-04-12 ENCOUNTER — Encounter (HOSPITAL_COMMUNITY): Payer: Self-pay | Admitting: Emergency Medicine

## 2019-04-12 DIAGNOSIS — Z8249 Family history of ischemic heart disease and other diseases of the circulatory system: Secondary | ICD-10-CM | POA: Insufficient documentation

## 2019-04-12 DIAGNOSIS — R0982 Postnasal drip: Secondary | ICD-10-CM | POA: Diagnosis not present

## 2019-04-12 DIAGNOSIS — U071 COVID-19: Secondary | ICD-10-CM | POA: Diagnosis not present

## 2019-04-12 DIAGNOSIS — J439 Emphysema, unspecified: Secondary | ICD-10-CM | POA: Diagnosis not present

## 2019-04-12 DIAGNOSIS — R519 Headache, unspecified: Secondary | ICD-10-CM | POA: Diagnosis present

## 2019-04-12 DIAGNOSIS — F17210 Nicotine dependence, cigarettes, uncomplicated: Secondary | ICD-10-CM | POA: Insufficient documentation

## 2019-04-12 DIAGNOSIS — R52 Pain, unspecified: Secondary | ICD-10-CM

## 2019-04-12 NOTE — ED Triage Notes (Signed)
Patient reports headache and body aches that started yesterday.  Patient complains of chills. Denies fever Patient has drainage in throat

## 2019-04-12 NOTE — Discharge Instructions (Signed)
You have been tested for COVID-19 today. °If your test returns positive, you will receive a phone call from [facility] regarding your results. °Negative test results are not called. °Both positive and negative results area always visible on MyChart. °If you do not have a MyChart account, sign up instructions are provided in your discharge papers. °Please do not hesitate to contact us should you have questions or concerns. ° °

## 2019-04-12 NOTE — ED Provider Notes (Signed)
[facility]   [id number] 04/12/19 Arrival Time: 1457  ASSESSMENT & PLAN:  1. Acute nonintractable headache, unspecified headache type   2. Generalized body aches   3. Post-nasal drainage      COVID-19 testing sent. See letter/work note on file for self-isolation guidelines. OTC symptom care as needed.  Follow-up Information    [facility]   Specialty: Urgent Care Why: If worsening or failing to improve as anticipated. Contact information: [address] [place] [address] [phone]          Reviewed expectations re: course of current medical issues. Questions answered. Outlined signs and symptoms indicating need for more acute intervention. Understanding verbalized. After Visit Summary given.   SUBJECTIVE: History from: patient. Casey Gallagher is a 29 y.o. male who reports generalized body aches, headache, and fatigue. Abrupt onset yesterday.Known COVID-19 contact: none. Recent travel: none. Denies: fever, cough and difficulty breathing. Normal PO intake without n/v/d. Also with post-nasal drainage. No OTC tx.    OBJECTIVE:  Vitals:   04/12/19 1538  BP: 110/66  Pulse: 73  Resp: 16  Temp: 98.6 F (37 C)  TempSrc: Oral  SpO2: 100%    General appearance: alert; no distress Eyes: PERRLA; EOMI; conjunctiva normal HENT: [place]; AT; mild nasal congestion Neck: supple  Lungs: speaks full sentences without difficulty; unlabored Extremities: no edema Skin: warm and dry Neurologic: normal gait Psychological: alert and cooperative; normal mood and affect  Labs:  Labs Reviewed  SARS CORONAVIRUS 2 (TAT 6-24 HRS)      No Known Allergies  Past Medical History:  Diagnosis Date  . Asthma   . Emphysema    Social History   Socioeconomic History  . Marital status: Single    Spouse name: Not on file  . Number of children: Not on file  . Years of education: Not on file  . Highest education level:  Not on file  Occupational History  . Not on file  Tobacco Use  . Smoking status: Current Every Day Smoker    Packs/day: 0.50    Years: 13.00    Pack years: 6.50    Types: Cigarettes  . Smokeless tobacco: Never Used  Substance and Sexual Activity  . Alcohol use: Yes    Comment: very rare  . Drug use: No  . Sexual activity: Yes  Other Topics Concern  . Not on file  Social History Narrative  . Not on file   Social Determinants of Health   Financial Resource Strain:   . Difficulty of Paying Living Expenses:   Food Insecurity:   . Worried About Programme researcher, broadcasting/film/video in the Last Year:   . Barista in the Last Year:   Transportation Needs:   . Freight forwarder (Medical):   Marland Kitchen Lack of Transportation (Non-Medical):   Physical Activity:   . Days of Exercise per Week:   . Minutes of Exercise per Session:   Stress:   . Feeling of Stress :   Social Connections:   . Frequency of Communication with Friends and Family:   . Frequency of Social Gatherings with Friends and Family:   . Attends Religious Services:   . Active Member of Clubs or Organizations:   . Attends Banker Meetings:   Marland Kitchen Marital Status:   Intimate Partner Violence:   . Fear of Current or Ex-Partner:   . Emotionally Abused:   Marland Kitchen Physically Abused:   . Sexually Abused:  Family History  Problem Relation Age of Onset  . Bipolar disorder Mother   . Heart failure Mother   . Schizophrenia Mother   . Heart failure Father   . Schizophrenia Father   . Bipolar disorder Father    Past Surgical History:  Procedure Laterality Date  . Evonnie Dawes, MD 04/12/19 [phone]

## 2019-04-13 LAB — SARS CORONAVIRUS 2 (TAT 6-24 HRS): SARS Coronavirus 2: POSITIVE — AB

## 2020-06-18 ENCOUNTER — Encounter (HOSPITAL_COMMUNITY): Payer: Self-pay | Admitting: Emergency Medicine

## 2020-06-18 ENCOUNTER — Ambulatory Visit (HOSPITAL_COMMUNITY)
Admission: EM | Admit: 2020-06-18 | Discharge: 2020-06-18 | Disposition: A | Discharge status: Home / Self Care | Payer: Medicaid Other | Attending: Urgent Care | Admitting: Urgent Care

## 2020-06-18 ENCOUNTER — Other Ambulatory Visit: Payer: Self-pay

## 2020-06-18 DIAGNOSIS — R059 Cough, unspecified: Secondary | ICD-10-CM | POA: Insufficient documentation

## 2020-06-18 DIAGNOSIS — J439 Emphysema, unspecified: Secondary | ICD-10-CM | POA: Diagnosis not present

## 2020-06-18 DIAGNOSIS — Z79899 Other long term (current) drug therapy: Secondary | ICD-10-CM | POA: Diagnosis not present

## 2020-06-18 DIAGNOSIS — Z20822 Contact with and (suspected) exposure to covid-19: Secondary | ICD-10-CM | POA: Insufficient documentation

## 2020-06-18 DIAGNOSIS — H6123 Impacted cerumen, bilateral: Secondary | ICD-10-CM | POA: Insufficient documentation

## 2020-06-18 DIAGNOSIS — F17210 Nicotine dependence, cigarettes, uncomplicated: Secondary | ICD-10-CM | POA: Insufficient documentation

## 2020-06-18 DIAGNOSIS — J029 Acute pharyngitis, unspecified: Secondary | ICD-10-CM | POA: Insufficient documentation

## 2020-06-18 DIAGNOSIS — J069 Acute upper respiratory infection, unspecified: Secondary | ICD-10-CM | POA: Insufficient documentation

## 2020-06-18 LAB — POCT RAPID STREP A, ED / UC: Streptococcus, Group A Screen (Direct): NEGATIVE

## 2020-06-18 MED ORDER — PROMETHAZINE-DM 6.25-15 MG/5ML PO SYRP: 5.0000 mL | ORAL_SOLUTION | Freq: Every evening | ORAL | 0 refills | Status: DC | PRN

## 2020-06-18 MED ORDER — CETIRIZINE HCL 10 MG PO TABS: 10.0000 mg | ORAL_TABLET | Freq: Every day | ORAL | 0 refills | Status: DC

## 2020-06-18 MED ORDER — CARBAMIDE PEROXIDE 6.5 % OT SOLN: 5.0000 [drp] | Freq: Two times a day (BID) | OTIC | 0 refills | Status: DC

## 2020-06-18 MED ORDER — FLUTICASONE PROPIONATE 50 MCG/ACT NA SUSP: 2.0000 | Freq: Every day | NASAL | 0 refills | Status: DC

## 2020-06-18 MED ORDER — BENZONATATE 100 MG PO CAPS: 100.0000 mg | ORAL_CAPSULE | Freq: Three times a day (TID) | ORAL | 0 refills | Status: DC | PRN

## 2020-06-18 MED ORDER — PSEUDOEPHEDRINE HCL 60 MG PO TABS: 60.0000 mg | ORAL_TABLET | Freq: Three times a day (TID) | ORAL | 0 refills | Status: DC | PRN

## 2020-06-18 NOTE — ED Triage Notes (Signed)
Yesterday started having sore throat and congestion.  Today has right ear pain, and right ear is stuffy.  Reports his wife and young children have been sick

## 2020-06-18 NOTE — ED Notes (Signed)
Notified mani, pa, of outcome of flushing ears

## 2020-06-18 NOTE — Discharge Instructions (Signed)

## 2020-06-18 NOTE — ED Provider Notes (Signed)
[facility]   MRN: [id number] DOB: [birth year]  Subjective:   Casey Gallagher is a 30 y.o. male presenting for 1 day history of acute onset sore throat, sinus congestion now having bilateral ear pain worse on the right.  Feels decreased hearing on the right as well.  Also has a cough.  Has multiple sick contacts at home.  Had his COVID vaccinations but not the booster.  History of asthma and emphysema.  Patient is still smoking as well.  No current facility-administered medications for this encounter.  Current Outpatient Medications:  .  ALBUTEROL IN, Inhale into the lungs., Disp: , Rfl:  .  Tiotropium Bromide Monohydrate (SPIRIVA HANDIHALER IN), Inhale into the lungs., Disp: , Rfl:  .  omeprazole (PRILOSEC) 20 MG capsule, Take 1 capsule (20 mg total) by mouth 2 (two) times daily before a meal for 14 days. (Patient not taking: Reported on 06/18/2020), Disp: 28 capsule, Rfl: 0   No Known Allergies  Past Medical History:  Diagnosis Date  . Asthma   . Emphysema      Past Surgical History:  Procedure Laterality Date  . TONSILLECTOMY      Family History  Problem Relation Age of Onset  . Bipolar disorder Mother   . Heart failure Mother   . Schizophrenia Mother   . Heart failure Father   . Schizophrenia Father   . Bipolar disorder Father     Social History   Tobacco Use  . Smoking status: Current Every Day Smoker    Packs/day: 0.50    Years: 13.00    Pack years: 6.50    Types: Cigarettes  . Smokeless tobacco: Never Used  Vaping Use  . Vaping Use: Never used  Substance Use Topics  . Alcohol use: Yes    Comment: very rare  . Drug use: No    ROS   Objective:   Vitals: BP 111/64 (BP Location: Left Arm)   Pulse 61   Temp 98.1 F (36.7 C) (Oral)   Resp 18   SpO2 98%   Physical Exam Constitutional:      General: He is not in acute distress.    Appearance: Normal appearance. He is well-developed and normal weight. He is not ill-appearing,  toxic-appearing or diaphoretic.  HENT:     Head: Normocephalic and atraumatic.     Right Ear: Ear canal and external ear normal. There is no impacted cerumen.     Left Ear: Tympanic membrane, ear canal and external ear normal. There is no impacted cerumen.     Ears:     Comments: Cerumen impaction bilaterally.    Nose: Nose normal. No congestion or rhinorrhea.     Mouth/Throat:     Mouth: Mucous membranes are moist.     Pharynx: Oropharynx is clear. Posterior oropharyngeal erythema present. No pharyngeal swelling, oropharyngeal exudate or uvula swelling.     Comments: Thick streaks of post-nasal drainage.  Eyes:     General: No scleral icterus.       Right eye: No discharge.        Left eye: No discharge.     Extraocular Movements: Extraocular movements intact.     Conjunctiva/sclera: Conjunctivae normal.     Pupils: Pupils are equal, round, and reactive to light.  Cardiovascular:     Rate and Rhythm: Normal rate and regular rhythm.     Heart sounds: Normal heart sounds. No murmur heard. No friction rub. No gallop.   Pulmonary:  Effort: Pulmonary effort is normal. No respiratory distress.     Breath sounds: Normal breath sounds. No stridor. No wheezing, rhonchi or rales.  Musculoskeletal:     Cervical back: Normal range of motion and neck supple. No rigidity. No muscular tenderness.  Neurological:     General: No focal deficit present.     Mental Status: He is alert and oriented to person, place, and time.  Psychiatric:        Mood and Affect: Mood normal.        Behavior: Behavior normal.        Thought Content: Thought content normal.     Results for orders placed or performed during the hospital encounter of 06/18/20 (from the past 24 hour(s))  POCT Rapid Strep A     Status: None   Collection Time: 06/18/20  4:44 PM  Result Value Ref Range   Streptococcus, Group A Screen (Direct) NEGATIVE NEGATIVE    Ear lavage performed using mixture of peroxide and water.   Pressure irrigation performed using a bottle and a thin ear tube.  Bilateral ear lavage.   Assessment and Plan :   PDMP not reviewed this encounter.  1. Viral URI with cough   2. Sore throat   3. Bilateral impacted cerumen     Will manage for viral illness such as viral URI, viral syndrome, viral rhinitis, COVID-19. Counseled patient on nature of COVID-19 including modes of transmission, diagnostic testing, management and supportive care.  Offered scripts for symptomatic relief. COVID 19 testing is pending.  Attempted bilateral ear and was successful on the left only.  Partial removal of the right was performed until patient was unable to tolerate this further.  Recommended using Debrox and follow-up with ENT if symptoms persist.  Counseled patient on potential for adverse effects with medications prescribed/recommended today, ER and return-to-clinic precautions discussed, patient verbalized understanding.     Wallis Bamberg, PA-C 06/18/20 1755

## 2020-06-19 LAB — SARS CORONAVIRUS 2 (TAT 6-24 HRS): SARS Coronavirus 2: NEGATIVE

## 2020-06-21 LAB — CULTURE, GROUP A STREP (THRC)

## 2020-10-15 ENCOUNTER — Other Ambulatory Visit: Payer: Self-pay

## 2020-10-15 ENCOUNTER — Ambulatory Visit
Admission: RE | Admit: 2020-10-15 | Discharge: 2020-10-15 | Disposition: A | Discharge status: Home / Self Care | Payer: Medicaid Other | Source: Ambulatory Visit | Attending: Internal Medicine | Admitting: Internal Medicine

## 2020-10-15 VITALS — BP 118/75 | HR 89 | Temp 99.9°F | Resp 18

## 2020-10-15 DIAGNOSIS — J101 Influenza due to other identified influenza virus with other respiratory manifestations: Secondary | ICD-10-CM | POA: Diagnosis not present

## 2020-10-15 DIAGNOSIS — R6889 Other general symptoms and signs: Secondary | ICD-10-CM

## 2020-10-15 DIAGNOSIS — Z20822 Contact with and (suspected) exposure to covid-19: Secondary | ICD-10-CM | POA: Diagnosis not present

## 2020-10-15 LAB — POCT INFLUENZA A/B
Influenza A, POC: POSITIVE — AB
Influenza B, POC: NEGATIVE

## 2020-10-15 MED ORDER — OSELTAMIVIR PHOSPHATE 75 MG PO CAPS: 75.0000 mg | ORAL_CAPSULE | Freq: Two times a day (BID) | ORAL | 0 refills | Status: DC

## 2020-10-15 NOTE — ED Triage Notes (Signed)
Daughter, son, and wife got sick and tested flu positive. Yesterday, patient began to have headache, cold chills, generalized body aches, diarrhea, mild cough.

## 2020-10-15 NOTE — ED Provider Notes (Signed)
[facility]    CSN: [id number] Arrival date & time: 10/15/20  1016      History   Chief Complaint Chief Complaint  Patient presents with   Cough    HPI Casey Gallagher is a 30 y.o. male.   Patient presents with headache, chills, body aches, diarrhea, cough that started yesterday.  Patient reports that daughter, son, wife have all tested positive for flu and have similar symptoms.  Denies any known fevers at home.  Denies chest pain or shortness of breath.  Patient has taken Tylenol Cold and flu over-the-counter with minimal improvement in symptoms.   Cough  Past Medical History:  Diagnosis Date   Asthma    Emphysema     There are no problems to display for this patient.   Past Surgical History:  Procedure Laterality Date   TONSILLECTOMY         Home Medications    Prior to Admission medications   Medication Sig Start Date End Date Taking? Authorizing Provider  oseltamivir (TAMIFLU) 75 MG capsule Take 1 capsule (75 mg total) by mouth every 12 (twelve) hours. 10/15/20  Yes Lance Muss, FNP  ALBUTEROL IN Inhale into the lungs.    [provider]  benzonatate (TESSALON) 100 MG capsule Take 1-2 capsules (100-200 mg total) by mouth 3 (three) times daily as needed for cough. 06/18/20   Wallis Bamberg, PA-C  carbamide peroxide (DEBROX) 6.5 % OTIC solution Place 5 drops into the right ear 2 (two) times daily. 06/18/20   Wallis Bamberg, PA-C  cetirizine (ZYRTEC ALLERGY) 10 MG tablet Take 1 tablet (10 mg total) by mouth daily. 06/18/20   Wallis Bamberg, PA-C  fluticasone (FLONASE) 50 MCG/ACT nasal spray Place 2 sprays into both nostrils daily. 06/18/20   Wallis Bamberg, PA-C  omeprazole (PRILOSEC) 20 MG capsule Take 1 capsule (20 mg total) by mouth 2 (two) times daily before a meal for 14 days. Patient not taking: Reported on 06/18/2020 04/10/19 04/24/19  Wieters, Hallie C, PA-C  promethazine-dextromethorphan (PROMETHAZINE-DM) 6.25-15 MG/5ML syrup Take 5 mLs by mouth at bedtime  as needed for cough. 06/18/20   Wallis Bamberg, PA-C  pseudoephedrine (SUDAFED) 60 MG tablet Take 1 tablet (60 mg total) by mouth every 8 (eight) hours as needed for congestion. 06/18/20   Wallis Bamberg, PA-C  Tiotropium Bromide Monohydrate (SPIRIVA HANDIHALER IN) Inhale into the lungs.    [provider]  albuterol (PROVENTIL HFA;VENTOLIN HFA) 108 (90 Base) MCG/ACT inhaler Inhale 2 puffs into the lungs every 6 (six) hours as needed for wheezing or shortness of breath.  04/12/19  [provider]    Family History Family History  Problem Relation Age of Onset   Bipolar disorder Mother    Heart failure Mother    Schizophrenia Mother    Heart failure Father    Schizophrenia Father    Bipolar disorder Father     Social History Social History   Tobacco Use   Smoking status: Every Day    Packs/day: 0.50    Years: 13.00    Pack years: 6.50    Types: Cigarettes   Smokeless tobacco: Never  Vaping Use   Vaping Use: Never used  Substance Use Topics   Alcohol use: Yes    Comment: very rare   Drug use: No     Allergies   Patient has no known allergies.   Review of Systems Review of Systems Per HPI  Physical Exam Triage Vital Signs ED Triage Vitals  Enc  Vitals Group     BP 10/15/20 1102 118/75     Pulse Rate 10/15/20 1102 89     Resp 10/15/20 1102 18     Temp 10/15/20 1102 99.9 F (37.7 C)     Temp Source 10/15/20 1102 Oral     SpO2 10/15/20 1102 97 %     Weight --      Height --      Head Circumference --      Peak Flow --      Pain Score 10/15/20 1109 9     Pain Loc --      Pain Edu? --      Excl. in GC? --    No data found.  Updated Vital Signs BP 118/75 (BP Location: Left Arm)   Pulse 89   Temp 99.9 F (37.7 C) (Oral)   Resp 18   SpO2 97%   Visual Acuity Right Eye Distance:   Left Eye Distance:   Bilateral Distance:    Right Eye Near:   Left Eye Near:    Bilateral Near:     Physical Exam Constitutional:      General: He is not in  acute distress.    Appearance: Normal appearance. He is ill-appearing. He is not toxic-appearing.  HENT:     Head: Normocephalic and atraumatic.     Right Ear: Tympanic membrane and ear canal normal.     Left Ear: Tympanic membrane and ear canal normal.     Nose: Congestion present.     Mouth/Throat:     Mouth: Mucous membranes are moist.     Pharynx: No posterior oropharyngeal erythema.  Eyes:     Extraocular Movements: Extraocular movements intact.     Conjunctiva/sclera: Conjunctivae normal.     Pupils: Pupils are equal, round, and reactive to light.  Cardiovascular:     Rate and Rhythm: Normal rate and regular rhythm.     Pulses: Normal pulses.     Heart sounds: Normal heart sounds.  Pulmonary:     Effort: Pulmonary effort is normal. No respiratory distress.     Breath sounds: Normal breath sounds. No wheezing.  Abdominal:     General: Abdomen is flat. Bowel sounds are normal.     Palpations: Abdomen is soft.  Musculoskeletal:        General: Normal range of motion.     Cervical back: Normal range of motion.  Skin:    General: Skin is warm and dry.  Neurological:     General: No focal deficit present.     Mental Status: He is alert and oriented to person, place, and time. Mental status is at baseline.  Psychiatric:        Mood and Affect: Mood normal.        Behavior: Behavior normal.     UC Treatments / Results  Labs (all labs ordered are listed, but only abnormal results are displayed) Labs Reviewed  POCT INFLUENZA A/B - Abnormal; Notable for the following components:      Result Value   Influenza A, POC Positive (*)    All other components within normal limits  NOVEL CORONAVIRUS, NAA    EKG   Radiology No results found.  Procedures Procedures (including critical care time)  Medications Ordered in UC Medications - No data to display  Initial Impression / Assessment and Plan / UC Course  I have reviewed the triage vital signs and the nursing  notes.  Pertinent labs & imaging  results that were available during my care of the patient were reviewed by me and considered in my medical decision making (see chart for details).     Rapid flu test was positive for influenza A.  Will treat with Tamiflu x5 days.  Discussed over-the-counter medications to help alleviate symptoms as well.  Fever monitoring and management discussed with patient.  Advised patient to follow-up if symptoms worsen or persist.Discussed strict return precautions. Patient verbalized understanding and is agreeable with plan.  Final Clinical Impressions(s) / UC Diagnoses   Final diagnoses:  Encounter for laboratory testing for COVID-19 virus  Flu-like symptoms  Influenza A     Discharge Instructions      You have tested positive for influenza A.  You have been prescribed Tamiflu to help treat this.  Please continue over-the-counter medications to help alleviate symptoms.  Monitor fever and treat as appropriate with Tylenol and/or ibuprofen.  Body aches may also be treated with Tylenol and ibuprofen.  Follow-up if symptoms persist or worsen.  COVID test is pending.  We will call if it is positive.     ED Prescriptions     Medication Sig Dispense Auth. Provider   oseltamivir (TAMIFLU) 75 MG capsule Take 1 capsule (75 mg total) by mouth every 12 (twelve) hours. 10 capsule Lance Muss, FNP      PDMP not reviewed this encounter.   Lance Muss, FNP 10/15/20 1141

## 2020-10-15 NOTE — Discharge Instructions (Addendum)
You have tested positive for influenza A.  You have been prescribed Tamiflu to help treat this.  Please continue over-the-counter medications to help alleviate symptoms.  Monitor fever and treat as appropriate with Tylenol and/or ibuprofen.  Body aches may also be treated with Tylenol and ibuprofen.  Follow-up if symptoms persist or worsen.  COVID test is pending.  We will call if it is positive.

## 2020-10-16 LAB — SARS-COV-2, NAA 2 DAY TAT

## 2020-10-16 LAB — NOVEL CORONAVIRUS, NAA: SARS-CoV-2, NAA: NOT DETECTED

## 2020-12-22 ENCOUNTER — Observation Stay (HOSPITAL_COMMUNITY)
Admission: EM | Admit: 2020-12-22 | Discharge: 2020-12-22 | Disposition: A | Discharge status: Home / Self Care | Payer: Medicaid Other | Attending: Internal Medicine | Admitting: Internal Medicine

## 2020-12-22 ENCOUNTER — Emergency Department (HOSPITAL_COMMUNITY): Payer: Medicaid Other

## 2020-12-22 ENCOUNTER — Ambulatory Visit (HOSPITAL_COMMUNITY): Payer: Medicaid Other

## 2020-12-22 ENCOUNTER — Other Ambulatory Visit: Payer: Self-pay

## 2020-12-22 ENCOUNTER — Encounter (HOSPITAL_COMMUNITY): Payer: Self-pay | Admitting: Emergency Medicine

## 2020-12-22 DIAGNOSIS — Y900 Blood alcohol level of less than 20 mg/100 ml: Secondary | ICD-10-CM | POA: Diagnosis not present

## 2020-12-22 DIAGNOSIS — R55 Syncope and collapse: Secondary | ICD-10-CM | POA: Diagnosis not present

## 2020-12-22 DIAGNOSIS — F17210 Nicotine dependence, cigarettes, uncomplicated: Secondary | ICD-10-CM | POA: Insufficient documentation

## 2020-12-22 DIAGNOSIS — J45909 Unspecified asthma, uncomplicated: Secondary | ICD-10-CM | POA: Diagnosis not present

## 2020-12-22 DIAGNOSIS — Z79899 Other long term (current) drug therapy: Secondary | ICD-10-CM | POA: Diagnosis not present

## 2020-12-22 DIAGNOSIS — R413 Other amnesia: Secondary | ICD-10-CM | POA: Diagnosis not present

## 2020-12-22 DIAGNOSIS — F1290 Cannabis use, unspecified, uncomplicated: Secondary | ICD-10-CM

## 2020-12-22 DIAGNOSIS — Z720 Tobacco use: Secondary | ICD-10-CM

## 2020-12-22 DIAGNOSIS — Z20822 Contact with and (suspected) exposure to covid-19: Secondary | ICD-10-CM | POA: Diagnosis not present

## 2020-12-22 LAB — CBC WITH DIFFERENTIAL/PLATELET
Abs Immature Granulocytes: 0.04 10*3/uL (ref 0.00–0.07)
Basophils Absolute: 0.1 10*3/uL (ref 0.0–0.1)
Basophils Relative: 1 %
Eosinophils Absolute: 0.2 10*3/uL (ref 0.0–0.5)
Eosinophils Relative: 2 %
HCT: 44.2 % (ref 39.0–52.0)
Hemoglobin: 15.2 g/dL (ref 13.0–17.0)
Immature Granulocytes: 1 %
Lymphocytes Relative: 31 %
Lymphs Abs: 2.5 10*3/uL (ref 0.7–4.0)
MCH: 30.9 pg (ref 26.0–34.0)
MCHC: 34.4 g/dL (ref 30.0–36.0)
MCV: 89.8 fL (ref 80.0–100.0)
Monocytes Absolute: 0.5 10*3/uL (ref 0.1–1.0)
Monocytes Relative: 6 %
Neutro Abs: 4.9 10*3/uL (ref 1.7–7.7)
Neutrophils Relative %: 59 %
Platelets: 155 10*3/uL (ref 150–400)
RBC: 4.92 MIL/uL (ref 4.22–5.81)
RDW: 12 % (ref 11.5–15.5)
WBC: 8.2 10*3/uL (ref 4.0–10.5)
nRBC: 0 % (ref 0.0–0.2)

## 2020-12-22 LAB — COMPREHENSIVE METABOLIC PANEL
ALT: 14 U/L (ref 0–44)
AST: 17 U/L (ref 15–41)
Albumin: 4.3 g/dL (ref 3.5–5.0)
Alkaline Phosphatase: 52 U/L (ref 38–126)
Anion gap: 8 (ref 5–15)
BUN: 11 mg/dL (ref 6–20)
CO2: 25 mmol/L (ref 22–32)
Calcium: 9.2 mg/dL (ref 8.9–10.3)
Chloride: 104 mmol/L (ref 98–111)
Creatinine, Ser: 1.02 mg/dL (ref 0.61–1.24)
GFR, Estimated: >60 mL/min (ref >60)
Glucose, Bld: 100 mg/dL — ABNORMAL HIGH (ref 70–99)
Potassium: 3.6 mmol/L (ref 3.5–5.1)
Sodium: 137 mmol/L (ref 135–145)
Total Bilirubin: 0.7 mg/dL (ref 0.3–1.2)
Total Protein: 6.4 g/dL — ABNORMAL LOW (ref 6.5–8.1)

## 2020-12-22 LAB — RAPID URINE DRUG SCREEN, HOSP PERFORMED
Amphetamines: NOT DETECTED
Barbiturates: NOT DETECTED
Benzodiazepines: NOT DETECTED
Cocaine: NOT DETECTED
Opiates: NOT DETECTED
Tetrahydrocannabinol: POSITIVE — AB

## 2020-12-22 LAB — I-STAT CHEM 8, ED
BUN: 12 mg/dL (ref 6–20)
Calcium, Ion: 1.18 mmol/L (ref 1.15–1.40)
Chloride: 102 mmol/L (ref 98–111)
Creatinine, Ser: 0.9 mg/dL (ref 0.61–1.24)
Glucose, Bld: 95 mg/dL (ref 70–99)
HCT: 45 % (ref 39.0–52.0)
Hemoglobin: 15.3 g/dL (ref 13.0–17.0)
Potassium: 3.7 mmol/L (ref 3.5–5.1)
Sodium: 139 mmol/L (ref 135–145)
TCO2: 25 mmol/L (ref 22–32)

## 2020-12-22 LAB — URINALYSIS, ROUTINE W REFLEX MICROSCOPIC
Bilirubin Urine: NEGATIVE
Glucose, UA: NEGATIVE mg/dL
Hgb urine dipstick: NEGATIVE
Ketones, ur: NEGATIVE mg/dL
Leukocytes,Ua: NEGATIVE
Nitrite: NEGATIVE
Protein, ur: NEGATIVE mg/dL
Specific Gravity, Urine: 1.02 (ref 1.005–1.030)
pH: 5 (ref 5.0–8.0)

## 2020-12-22 LAB — RESP PANEL BY RT-PCR (FLU A&B, COVID) ARPGX2
Influenza A by PCR: NEGATIVE
Influenza B by PCR: NEGATIVE
SARS Coronavirus 2 by RT PCR: NEGATIVE

## 2020-12-22 LAB — ETHANOL: Alcohol, Ethyl (B): <10 mg/dL (ref <10)

## 2020-12-22 MED ORDER — ACETAMINOPHEN 650 MG RE SUPP: 650.0000 mg | Freq: Four times a day (QID) | RECTAL | Status: DC | PRN

## 2020-12-22 MED ORDER — ALBUTEROL SULFATE (2.5 MG/3ML) 0.083% IN NEBU: 2.5000 mg | INHALATION_SOLUTION | Freq: Four times a day (QID) | RESPIRATORY_TRACT | Status: DC | PRN

## 2020-12-22 MED ORDER — NICOTINE 14 MG/24HR TD PT24: 14.0000 mg | MEDICATED_PATCH | Freq: Every day | TRANSDERMAL | Status: DC

## 2020-12-22 MED ORDER — ACETAMINOPHEN 325 MG PO TABS: 650.0000 mg | ORAL_TABLET | Freq: Four times a day (QID) | ORAL | Status: DC | PRN

## 2020-12-22 NOTE — ED Provider Notes (Addendum)
[facility] EMERGENCY DEPARTMENT Provider Note   CSN: [id number] Arrival date & time: 12/22/20  1605     History Chief Complaint  Patient presents with   syncopal episode    Casey Gallagher is a 30 y.o. male.  HPI Patient presents after an episode of syncope.  Reportedly was feeling off since he got up this morning.  States it was almost a dj vu feeling.  States that he felt his heart racing earlier in the day.  States he was sitting in the chair and felt lightheaded.  States he had another episode later where he was up to get some food.  States he felt his heart racing and felt lightheaded and passed out.  Did not hurt anything with the fall but did hit his head.  Was confused after but came to pretty quickly.  No loss of bladder or bowel control.  Eyes reportedly rolled back in his head but no seizure activity seen.  Had episode passing out years ago when he was in middle school.  No family history of abnormal cardiac disease.  States he has not been sleeping very well.  States he has a child with special needs and is only sleeping 3 to 4 hours a night.  States he has not been taking care of himself very well  Past Medical History:  Diagnosis Date   Asthma    Emphysema     Patient Active Problem List   Diagnosis Date Noted   Syncope 12/22/2020   Asthma 12/22/2020   Tobacco use 12/22/2020   Marijuana use 12/22/2020     Past Surgical History:  Procedure Laterality Date   TONSILLECTOMY         Family History  Problem Relation Age of Onset   Bipolar disorder Mother    Heart failure Mother    Schizophrenia Mother    Heart failure Father    Schizophrenia Father    Bipolar disorder Father     Social History   Tobacco Use   Smoking status: Every Day    Packs/day: 0.50    Years: 13.00    Pack years: 6.50    Types: Cigarettes   Smokeless tobacco: Never  Vaping Use   Vaping Use: Never used  Substance Use Topics   Alcohol use: Yes    Comment:  very rare   Drug use: Yes    Types: Marijuana    Home Medications Prior to Admission medications   Medication Sig Start Date End Date Taking? Authorizing Provider  benzonatate (TESSALON) 100 MG capsule Take 1-2 capsules (100-200 mg total) by mouth 3 (three) times daily as needed for cough. Patient not taking: Reported on 12/22/2020 06/18/20   Wallis Bamberg, PA-C  carbamide peroxide (DEBROX) 6.5 % OTIC solution Place 5 drops into the right ear 2 (two) times daily. Patient not taking: Reported on 12/22/2020 06/18/20   Wallis Bamberg, PA-C  cetirizine (ZYRTEC ALLERGY) 10 MG tablet Take 1 tablet (10 mg total) by mouth daily. Patient not taking: Reported on 12/22/2020 06/18/20   Wallis Bamberg, PA-C  fluticasone [facility]) 50 MCG/ACT nasal spray Place 2 sprays into both nostrils daily. Patient not taking: Reported on 12/22/2020 06/18/20   Wallis Bamberg, PA-C  omeprazole (PRILOSEC) 20 MG capsule Take 1 capsule (20 mg total) by mouth 2 (two) times daily before a meal for 14 days. Patient not taking: Reported on 06/18/2020 04/10/19 04/24/19  Wieters, Junius Creamer, PA-C  oseltamivir (TAMIFLU) 75 MG capsule Take 1 capsule (75 mg  total) by mouth every 12 (twelve) hours. Patient not taking: Reported on 12/22/2020 10/15/20   Gustavus Bryant, FNP  promethazine-dextromethorphan (PROMETHAZINE-DM) 6.25-15 MG/5ML syrup Take 5 mLs by mouth at bedtime as needed for cough. Patient not taking: Reported on 12/22/2020 06/18/20   Wallis Bamberg, PA-C  pseudoephedrine (SUDAFED) 60 MG tablet Take 1 tablet (60 mg total) by mouth every 8 (eight) hours as needed for congestion. Patient not taking: Reported on 12/22/2020 06/18/20   Wallis Bamberg, PA-C  albuterol (PROVENTIL HFA;VENTOLIN HFA) 108 [phone] Base) MCG/ACT inhaler Inhale 2 puffs into the lungs every 6 (six) hours as needed for wheezing or shortness of breath.  04/12/19  [provider]    Allergies    Patient has no known allergies.  Review of Systems   Review of Systems  Constitutional:   Negative for appetite change.  HENT:  Negative for congestion.   Respiratory:  Negative for shortness of breath.   Cardiovascular:  Positive for palpitations. Negative for chest pain.  Gastrointestinal:  Negative for abdominal pain.  Genitourinary:  Negative for frequency.  Musculoskeletal:  Negative for back pain.  Skin:  Negative for rash.  Neurological:  Positive for syncope.   Physical Exam Updated Vital Signs BP 129/78   Pulse (!) 55   Temp 98 F (36.7 C)   Resp 19   SpO2 100%   Physical Exam Vitals and nursing note reviewed.  HENT:     Head: Normocephalic.  Eyes:     Pupils: Pupils are equal, round, and reactive to light.  Cardiovascular:     Rate and Rhythm: Regular rhythm.  Pulmonary:     Breath sounds: No wheezing or rhonchi.  Abdominal:     Tenderness: There is no abdominal tenderness.  Musculoskeletal:        General: No tenderness.     Cervical back: Neck supple.  Skin:    General: Skin is warm.     Capillary Refill: Capillary refill takes less than 2 seconds.  Neurological:     Mental Status: He is alert and oriented to person, place, and time.  Psychiatric:        Mood and Affect: Mood normal.    ED Results / Procedures / Treatments   Labs (all labs ordered are listed, but only abnormal results are displayed) Labs Reviewed  COMPREHENSIVE METABOLIC PANEL - Abnormal; Notable for the following components:      Result Value   Glucose, Bld 100 (*)    Total Protein 6.4 (*)    All other components within normal limits  RAPID URINE DRUG SCREEN, HOSP PERFORMED - Abnormal; Notable for the following components:   Tetrahydrocannabinol POSITIVE (*)    All other components within normal limits  RESP PANEL BY RT-PCR (FLU A&B, COVID) ARPGX2  CBC WITH DIFFERENTIAL/PLATELET  ETHANOL  URINALYSIS, ROUTINE W REFLEX MICROSCOPIC  I-STAT CHEM 8, ED    EKG EKG Interpretation  Date/Time:  Sunday December 22 2020 16:42:35 EST Ventricular Rate:  54 PR  Interval:  126 QRS Duration: 100 QT Interval:  402 QTC Calculation: 381 R Axis:   91 Text Interpretation: Sinus bradycardia Rightward axis Incomplete right bundle branch block Borderline ECG No significant change since last tracing Confirmed by Susy Frizzle [phone]) on 12/22/2020 5:11:07 PM  Radiology DG Chest 2 View  Result Date: 12/22/2020 CLINICAL DATA:  Seizure, syncope EXAM: CHEST - 2 VIEW COMPARISON:  None. FINDINGS: The heart size and mediastinal contours are within normal limits. Both lungs are clear. The visualized  skeletal structures are unremarkable. IMPRESSION: Negative. Electronically Signed   By: Charlett Nose M.D.   On: 12/22/2020 17:14   CT Head Wo Contrast  Result Date: 12/22/2020 CLINICAL DATA:  Seizure EXAM: CT HEAD WITHOUT CONTRAST TECHNIQUE: Contiguous axial images were obtained from the base of the skull through the vertex without intravenous contrast. COMPARISON:  12/20/2003 FINDINGS: Brain: No acute intracranial abnormality. Specifically, no hemorrhage, hydrocephalus, mass lesion, acute infarction, or significant intracranial injury. Vascular: No hyperdense vessel or unexpected calcification. Skull: No acute calvarial abnormality. Sinuses/Orbits: No acute findings Other: None IMPRESSION: Normal study. Electronically Signed   By: Charlett Nose M.D.   On: 12/22/2020 17:27   CT Cervical Spine Wo Contrast  Result Date: 12/22/2020 CLINICAL DATA:  Neck trauma, midline tenderness.  Neck pain EXAM: CT CERVICAL SPINE WITHOUT CONTRAST TECHNIQUE: Multidetector CT imaging of the cervical spine was performed without intravenous contrast. Multiplanar CT image reconstructions were also generated. COMPARISON:  None. FINDINGS: Alignment: Normal Skull base and vertebrae: No acute fracture. No primary bone lesion or focal pathologic process. Soft tissues and spinal canal: No prevertebral fluid or swelling. No visible canal hematoma. Disc levels:  Maintained Upper chest: Negative Other:  None IMPRESSION: No acute bony abnormality. Electronically Signed   By: Charlett Nose M.D.   On: 12/22/2020 17:29    Procedures Procedures   Medications Ordered in ED Medications - No data to display  ED Course  I have reviewed the triage vital signs and the nursing notes.  Pertinent labs & imaging results that were available during my care of the patient were reviewed by me and considered in my medical decision making (see chart for details).    MDM Rules/Calculators/A&P                           Patient presented after syncopal episode.  Had felt his heart racing and felt bad.  Then passed out.  Had first episode in a chair but then another episode later while he was standing up.  Little confusion after.  Wife saw no seizure activity but he did hit his head.  States his eyes rolling back in his head.  Has not been sleeping much due to having a special needs child at home.  Had been doing well.  Head CT is reassuring.  Lab work was reassuring.  No direct cause been found.  Patient did not been placed on the monitor but his pulse ox had not shown tachycardia during the time here.  Had been put up for discharge, however came back in and patient no longer remember the events of both the fall and being in the ER.  Remembers me being in there but does not member anything that happened.  Is not going to x-ray or CAT scan.  With this acute mental status change will have neurology consulted.  Discussed with Dr. Amada Jupiter from neurology who is also seen patient.  The syncope itself sounded more cardiogenic, although usually not give this kind of memory loss in this pattern.  Although potentially could have had another episode when he was not on direct cardiac monitoring in the ER.  Recommends MRI and he will arrange for EEG.  Recommends admission to the hospital.  Patient does not have a PCP.  Will discuss with unassigned medicine  Patient no longer willing to stay.  Had discussed extensively with  patient.  Will leave AMA.  Have given more follow-up information Final Clinical Impression(s) /  ED Diagnoses Final diagnoses:  Syncope, unspecified syncope type  Memory loss    Rx / DC Orders ED Discharge Orders     None        Benjiman Core, MD 12/22/20 2147    Benjiman Core, MD 12/22/20 [phone]

## 2020-12-22 NOTE — ED Provider Notes (Cosign Needed)
Emergency Medicine Provider Triage Evaluation Note  Casey Gallagher , a 30 y.o. male  was evaluated in triage.  Pt complains of recurrent and very strong feelings of dj vu.  He states that this morning he woke up feeling abnormal.  He describes this as a mixture of both lightheaded and dj vu.  He had a episode that lasted about 5 minutes earlier today where he felt very flushed in his chest, had strong feelings of dj vu and pressure behind his eyes.  This lasted about 5 to 6 minutes and then resolved.  He then had a second episode this afternoon where he felt hot and flushed similarly for about 5 seconds and then went unconscious.  His wife states she heard him fall and that he was unconscious for about 30 seconds. She states that his eyes were wide open and slowly rolled back into his head, she did not note any nystagmus or similar ocular movements.  She denies any twitching.  He did not have incontinence.  He was not confused after.  He reports he currently does have a intermittent throbbing headache.  He reports mild neck pain since the fall.  He denies any stimulant use, no medications other than occasional CBD use.  No use of tramadol or Wellbutrin.  No regular alcohol use or concern for withdrawal.  He denies any recreational drug use or stimulant  He does note that he is averaging about 3 to 4 hours asleep of night.  He states that his child who is about 1-1/2 has significant special needs and has been having sleep regression.  Review of Systems  Positive: LOC, strong feelings of dj vu. Negative: Twitching movements  Physical Exam  BP (!) 155/79   Pulse 60   Temp 98 F (36.7 C)   Resp 15   SpO2 100%  Gen:   Awake, no distress   Resp:  Normal effort  MSK:   Moves extremities without difficulty  Other:  Patient is awake and alert.  Normal speech.  Answers questions appropriately without difficulty.  Medical Decision Making  Medically screening exam initiated at 4:40 PM.   Appropriate orders placed.  Casey Gallagher was informed that the remainder of the evaluation will be completed by another provider, this initial triage assessment does not replace that evaluation, and the importance of remaining in the ED until their evaluation is complete.  With patient's strong feelings of dj vu today, followed by a brief episode of unconsciousness I am concerned that he may have had a first-time seizure. Will order labs. Additionally will order CT scan of head for concern of first-time seizure and of his neck given he reports mild neck pain after falling.   Cristina Gong, [place] 12/22/20 1644

## 2020-12-22 NOTE — Plan of Care (Signed)
I was called to admit the patient for work-up of syncope and amnesia.  Neurology had recommended hospital admission for brain MRI and EEG.  When I went to see the patient, he informed me that he does not want to be admitted to the hospital.  I have explained to him that refusing hospital admission would be AGAINST MEDICAL ADVICE.  Patient AAO x4 and appears to have decision-making capacity.  I tried my very best to convince him to stay in the hospital for further work-up but patient continued to adamantly decline hospital admission.  He told me that he understands that refusing hospital admission would be AGAINST MEDICAL ADVICE.  Dr. Rubin Payor also present at bedside and aware.

## 2020-12-22 NOTE — Consult Note (Signed)
Neurology Consultation Reason for Consult: Spells Referring Physician: Duncan Dull  CC: Loss of consciousness  History is obtained from: Patient  HPI: Casey Gallagher is a 30 y.o. male with a history of asthma who was in his normal state of health yesterday.  He states that he woke up feeling "weird" which he describes as a sense of lightheadedness/dj vu.  He has had a common problem with presyncope when standing, and has had to make adjustments with standing up more slowly to avoid it.  Today, however he  had two episodes of loss of consciousness.  The first occurred while he was sitting, and he felt lightheaded and then felt his heart fluttering, and then lost consciousness.  When he came to, he rapidly returned to his normal self.  He then had another episode later in the day, again felt his heart fluttering beforehand.  He states that he also noticed tunnel vision and some diffuse whole body tingling prior to losing consciousness.  He was standing with this episode.  He did not have any convulsive type activity.  He was evaluated for this in the emergency department, with the presumption of some type of syncope.  He underwent evaluation with EKG, head CT with all cause identified.  At some point during his hospitalization, he became slightly confused and was amnestic to some of the test that had been done.  He states that he feels like he just has a period of time but he does not remember well.  He was connected to telemetry after this.  He currently feels like his memory is returning, and feels much less confused.  Of note, while I am in the room his heart rate was in the mid 50s.  He is not an athlete.  ROS: A 14 point ROS was performed and is negative except as noted in the HPI.  Past Medical History:  Diagnosis Date   Asthma    Emphysema      Family History  Problem Relation Age of Onset   Bipolar disorder Mother    Heart failure Mother    Schizophrenia Mother    Heart failure  Father    Schizophrenia Father    Bipolar disorder Father      Social History:  reports that he has been smoking cigarettes. He has a 6.50 pack-year smoking history. He has never used smokeless tobacco. He reports current alcohol use. He reports current drug use. Drug: Marijuana.   Exam: Current vital signs: BP 123/67   Pulse (!) 59   Temp 98 F (36.7 C)   Resp 17   SpO2 99%  Vital signs in last 24 hours: Temp:  [98 F (36.7 C)] 98 F (36.7 C) (12/11 1622) Pulse Rate:  [55-71] 59 (12/11 2045) Resp:  [15-17] 17 (12/11 2045) BP: (113-155)/(67-91) 123/67 (12/11 2045) SpO2:  [99 %-100 %] 99 % (12/11 2045)   Physical Exam  Constitutional: Appears well-developed and well-nourished.  Psych: Affect appropriate to situation Eyes: No scleral injection HENT: No OP obstruction MSK: no joint deformities.  Cardiovascular: Normal rate and regular rhythm.  Respiratory: Effort normal, non-labored breathing GI: Soft.  No distension. There is no tenderness.  Skin: WDI  Neuro: Mental Status: Patient is awake, alert, oriented to person, place, month, year, and situation. Patient is able to give a clear and coherent history. No signs of aphasia or neglect Cranial Nerves: II: Visual Fields are full. Pupils are equal, round, and reactive to light.   III,IV, VI: EOMI without  ptosis or diploplia.  V: Facial sensation is symmetric to temperature VII: Facial movement is symmetric.  VIII: hearing is intact to voice X: Uvula elevates symmetrically XI: Shoulder shrug is symmetric. XII: tongue is midline without atrophy or fasciculations.  Motor: Tone is normal. Bulk is normal. 5/5 strength was present in all four extremities.  Sensory: Sensation is symmetric to light touch and temperature in the arms and legs. Cerebellar: FNF and HKS are intact bilaterally   I have reviewed labs in epic and the results pertinent to this consultation are: Creatinine 0.9  I have reviewed the images  obtained: CT head-negative  Impression: 30 year old male with two episodes of syncope preceded by fluttering in his chest.  From the description, the semiology seems much more consistent with cardiac syncope than seizure, however the period of amnesia is unusual.  I do wonder with his bradycardia if he did have a transient episode of mild hypoperfusion, but this is unclear and he did not have any further syncopal episodes.  With this complaint, I do think further evaluation is warranted and I will order MRI/EEG.  Recommendations: 1) MRI brain 2) EEG 3) cardiac syncope work-up 4) neurology will follow   Ritta Slot, MD [facility] [phone]  If 7pm- 7am, please page neurology on call as listed in [facility].

## 2020-12-22 NOTE — ED Triage Notes (Signed)
Pt reports feeling like "deja vu" ever since waking up this morning with feeling lightheaded.  Reports syncopal episode this afternoon where he fell and hit the back of his head on cement.  Reports pain to back of his head, nausea, and vomited x 1 post fall.  Wife reports his eyes were open, rolled back, he fell and hit head and it took him a few minutes to respond.  States she thought he was having a seizure but denies any shaking or seizure like activity.

## 2020-12-22 NOTE — Discharge Instructions (Addendum)
You are leaving against our advice.  Follow-up with cardiology and one of the neurology groups.  Feel free to return for further work-up

## 2020-12-23 ENCOUNTER — Telehealth (INDEPENDENT_AMBULATORY_CARE_PROVIDER_SITE_OTHER): Payer: Medicaid Other | Admitting: Nurse Practitioner

## 2020-12-23 DIAGNOSIS — R55 Syncope and collapse: Secondary | ICD-10-CM

## 2020-12-23 MED ORDER — ALBUTEROL SULFATE HFA 108 (90 BASE) MCG/ACT IN AERS: 2.0000 | INHALATION_SPRAY | Freq: Four times a day (QID) | RESPIRATORY_TRACT | 0 refills | Status: DC | PRN

## 2020-12-23 MED ORDER — SPIRIVA RESPIMAT 1.25 MCG/ACT IN AERS: 2.0000 | INHALATION_SPRAY | Freq: Every day | RESPIRATORY_TRACT | 0 refills | Status: DC

## 2020-12-23 NOTE — Progress Notes (Signed)
Virtual Visit via Telephone Note  I connected with Casey Gallagher. on 12/23/20 at  2:20 PM EST by telephone and verified that I am speaking with the correct person using two identifiers.  Location: Patient: home Provider: office   I discussed the limitations, risks, security and privacy concerns of performing an evaluation and management service by telephone and the availability of in person appointments. I also discussed with the patient that there may be a patient responsible charge related to this service. The patient expressed understanding and agreed to proceed.   History of Present Illness:  Patient presents today for refill on inhalers.  Patient states that he has been taking Spiriva and Ventolin as needed for history of asthma.  We will fill this for him today.  Patient was recently seen in the ED but left AMA after syncopal episode.  It was recommended that patient be seen by neurology while in the ED and have the MRI completed.  Patient left before this could be completed.  It is also recommended that he have a cardiology referral.  We will place referral for cardiology for him today and have him follow closely to establish care with either Dr. Andrey Campanile or Amy at this practice.  Patient states that he does not smoke CBD. Denies f/c/s, n/v/d, hemoptysis, PND, chest pain or edema.     Observations/Objective:  Vitals with BMI 12/22/2020 12/22/2020 12/22/2020  Weight - - -  Systolic 129 130 599  Diastolic 78 81 73  Pulse 55 74 50      Assessment and Plan:  Medication refills Syncope:  Will place referral to cardiology  Stay well hydrated  Will refill inhalers  Follow up:  Will schedule close follow up with Dr. Andrey Campanile or Amy for further evaluation - please return to the ED with any worsening symptoms    I discussed the assessment and treatment plan with the patient. The patient was provided an opportunity to ask questions and all were answered. The patient agreed with  the plan and demonstrated an understanding of the instructions.   The patient was advised to call back or seek an in-person evaluation if the symptoms worsen or if the condition fails to improve as anticipated.  I provided 23 minutes of non-face-to-face time during this encounter.   Ivonne Andrew, NP

## 2020-12-25 ENCOUNTER — Other Ambulatory Visit: Payer: Self-pay

## 2020-12-25 ENCOUNTER — Emergency Department (HOSPITAL_COMMUNITY): Payer: Medicaid Other

## 2020-12-25 ENCOUNTER — Emergency Department (HOSPITAL_COMMUNITY)
Admission: EM | Admit: 2020-12-25 | Discharge: 2020-12-25 | Disposition: A | Discharge status: Home / Self Care | Payer: Medicaid Other | Attending: Emergency Medicine | Admitting: Emergency Medicine

## 2020-12-25 ENCOUNTER — Encounter: Payer: Self-pay | Admitting: Nurse Practitioner

## 2020-12-25 DIAGNOSIS — R55 Syncope and collapse: Secondary | ICD-10-CM | POA: Insufficient documentation

## 2020-12-25 DIAGNOSIS — Z7951 Long term (current) use of inhaled steroids: Secondary | ICD-10-CM | POA: Insufficient documentation

## 2020-12-25 DIAGNOSIS — J45909 Unspecified asthma, uncomplicated: Secondary | ICD-10-CM | POA: Diagnosis not present

## 2020-12-25 DIAGNOSIS — R519 Headache, unspecified: Secondary | ICD-10-CM | POA: Diagnosis not present

## 2020-12-25 DIAGNOSIS — F17210 Nicotine dependence, cigarettes, uncomplicated: Secondary | ICD-10-CM | POA: Insufficient documentation

## 2020-12-25 LAB — COMPREHENSIVE METABOLIC PANEL
ALT: 13 U/L (ref 0–44)
AST: 16 U/L (ref 15–41)
Albumin: 4.1 g/dL (ref 3.5–5.0)
Alkaline Phosphatase: 48 U/L (ref 38–126)
Anion gap: 6 (ref 5–15)
BUN: 10 mg/dL (ref 6–20)
CO2: 27 mmol/L (ref 22–32)
Calcium: 9.4 mg/dL (ref 8.9–10.3)
Chloride: 105 mmol/L (ref 98–111)
Creatinine, Ser: 1.01 mg/dL (ref 0.61–1.24)
GFR, Estimated: >60 mL/min (ref >60)
Glucose, Bld: 82 mg/dL (ref 70–99)
Potassium: 4.1 mmol/L (ref 3.5–5.1)
Sodium: 138 mmol/L (ref 135–145)
Total Bilirubin: 0.5 mg/dL (ref 0.3–1.2)
Total Protein: 6.3 g/dL — ABNORMAL LOW (ref 6.5–8.1)

## 2020-12-25 LAB — URINALYSIS, ROUTINE W REFLEX MICROSCOPIC
Bacteria, UA: NONE SEEN
Bilirubin Urine: NEGATIVE
Glucose, UA: NEGATIVE mg/dL
Hgb urine dipstick: NEGATIVE
Ketones, ur: NEGATIVE mg/dL
Leukocytes,Ua: NEGATIVE
Nitrite: NEGATIVE
Protein, ur: NEGATIVE mg/dL
Specific Gravity, Urine: 1.015 (ref 1.005–1.030)
pH: 6 (ref 5.0–8.0)

## 2020-12-25 LAB — CBC WITH DIFFERENTIAL/PLATELET
Abs Immature Granulocytes: 0.01 10*3/uL (ref 0.00–0.07)
Basophils Absolute: 0 10*3/uL (ref 0.0–0.1)
Basophils Relative: 1 %
Eosinophils Absolute: 0.1 10*3/uL (ref 0.0–0.5)
Eosinophils Relative: 2 %
HCT: 43 % (ref 39.0–52.0)
Hemoglobin: 14.5 g/dL (ref 13.0–17.0)
Immature Granulocytes: 0 %
Lymphocytes Relative: 37 %
Lymphs Abs: 2.3 10*3/uL (ref 0.7–4.0)
MCH: 30.7 pg (ref 26.0–34.0)
MCHC: 33.7 g/dL (ref 30.0–36.0)
MCV: 90.9 fL (ref 80.0–100.0)
Monocytes Absolute: 0.5 10*3/uL (ref 0.1–1.0)
Monocytes Relative: 8 %
Neutro Abs: 3.2 10*3/uL (ref 1.7–7.7)
Neutrophils Relative %: 52 %
Platelets: 152 10*3/uL (ref 150–400)
RBC: 4.73 MIL/uL (ref 4.22–5.81)
RDW: 12 % (ref 11.5–15.5)
WBC: 6.2 10*3/uL (ref 4.0–10.5)
nRBC: 0 % (ref 0.0–0.2)

## 2020-12-25 LAB — TROPONIN I (HIGH SENSITIVITY)
Troponin I (High Sensitivity): 4 ng/L (ref <18)
Troponin I (High Sensitivity): 4 ng/L (ref <18)

## 2020-12-25 LAB — MAGNESIUM: Magnesium: 2 mg/dL (ref 1.7–2.4)

## 2020-12-25 NOTE — Patient Instructions (Signed)
Medication refills Syncope:  Will place referral to cardiology  Stay well hydrated  Will refill inhalers  Follow up:  Will schedule close follow up with Dr. Andrey Campanile or Amy for further evaluation - please return to the ED with any worsening symptoms

## 2020-12-25 NOTE — ED Provider Notes (Signed)
Emergency Medicine Provider Triage Evaluation Note  Casey Pro. , a 30 y.o. male  was evaluated in triage.  Pt complains of ongoing headache.  He was seen 3 days ago on the 11th for syncope and amnesia.  He was recommended for admission, however he left AMA.  Plan was to get EEG and MRI at that time.  He states that he is having ongoing headache.  He did have a similar episode with some chest pain on Monday and near syncope.  Review of Systems  Positive: See above Negative: See above  Physical Exam  BP (!) 130/93    Pulse (!) 56    Temp 98.2 F (36.8 C) (Oral)    Resp 16    SpO2 100%  Gen:   Awake, no distress   Resp:  Normal effort  MSK:   Moves extremities without difficulty  Other:  Normal speech.   Medical Decision Making  Medically screening exam initiated at 1:45 PM.  Appropriate orders placed.  Casey Pro. was informed that the remainder of the evaluation will be completed by another provider, this initial triage assessment does not replace that evaluation, and the importance of remaining in the ED until their evaluation is complete.  Patient presents back after leaving AMA. I personally EMS seen him 3 days ago when he came in. It appears that he was getting admitted for MRI and EEG.  He denies any implants or claustrophobia.  We will order MRI.  Repeat blood work.  As based on notes it appears neurology suspected that this was cardiac in nature and he is having some chest pain will order troponin and repeat EKG.   Note: Portions of this report may have been transcribed using voice recognition software. Every effort was made to ensure accuracy; however, inadvertent computerized transcription errors may be present    Casey Gallagher 12/25/20 1350    Wynetta Fines, MD 12/26/20 1345

## 2020-12-25 NOTE — ED Notes (Signed)
Pt denies vision changes, N/V w/HA, but states has dizziness w/HA.

## 2020-12-25 NOTE — Discharge Instructions (Addendum)
1.  Follow-up with cardiology as scheduled. 2.  Return to the emergency department if you have new concerning or worsening symptoms.

## 2020-12-25 NOTE — ED Triage Notes (Signed)
Pt reports syncopal episode with fall, for which he was evaluated here, on Sunday. Patient left workup AMA but returns today for eval of tension headache, neck pain, and lower back pain he noticed when he got up this morning. Also endorses chest tightness, worse with exertion. No OTC meds PTA.

## 2020-12-25 NOTE — ED Provider Notes (Signed)
[facility] EMERGENCY DEPARTMENT Provider Note   CSN: [id number] Arrival date & time: 12/25/20  1145     History Chief Complaint  Patient presents with   Headache    Casey Gallagher. is a 30 y.o. male.  HPI Patient had 2 episodes of loss of consciousness on 12\11\2022.  He was seen in the emergency department at that time and evaluated by neurology.  He was recommended to get MRI but at that time signed out before completion of evaluation.  Patient reports that he returned because he was worried about having a medical problem and today he continues to have persistent headache which he has had for 3 days.  Generalized aching quality.  He also reports that, and achy feeling in his chest.     Past Medical History:  Diagnosis Date   Asthma    Emphysema     Patient Active Problem List   Diagnosis Date Noted   Syncope 12/22/2020   Asthma 12/22/2020   Tobacco use 12/22/2020   Marijuana use 12/22/2020    Past Surgical History:  Procedure Laterality Date   TONSILLECTOMY         Family History  Problem Relation Age of Onset   Bipolar disorder Mother    Heart failure Mother    Schizophrenia Mother    Heart failure Father    Schizophrenia Father    Bipolar disorder Father     Social History   Tobacco Use   Smoking status: Every Day    Packs/day: 0.50    Years: 13.00    Pack years: 6.50    Types: Cigarettes   Smokeless tobacco: Never  Vaping Use   Vaping Use: Never used  Substance Use Topics   Alcohol use: Yes    Comment: very rare   Drug use: Yes    Types: Marijuana    Home Medications Prior to Admission medications   Medication Sig Start Date End Date Taking? Authorizing Provider  albuterol (VENTOLIN HFA) 108 (90 Base) MCG/ACT inhaler Inhale 2 puffs into the lungs every 6 (six) hours as needed for wheezing or shortness of breath. 12/23/20   Ivonne Andrew, NP  benzonatate (TESSALON) 100 MG capsule Take 1-2 capsules (100-200 mg total)  by mouth 3 (three) times daily as needed for cough. Patient not taking: Reported on 12/22/2020 06/18/20   Wallis Bamberg, PA-C  carbamide peroxide (DEBROX) 6.5 % OTIC solution Place 5 drops into the right ear 2 (two) times daily. Patient not taking: Reported on 12/22/2020 06/18/20   Wallis Bamberg, PA-C  cetirizine (ZYRTEC ALLERGY) 10 MG tablet Take 1 tablet (10 mg total) by mouth daily. Patient not taking: Reported on 12/22/2020 06/18/20   Wallis Bamberg, PA-C  fluticasone [facility]) 50 MCG/ACT nasal spray Place 2 sprays into both nostrils daily. Patient not taking: Reported on 12/22/2020 06/18/20   Wallis Bamberg, PA-C  omeprazole (PRILOSEC) 20 MG capsule Take 1 capsule (20 mg total) by mouth 2 (two) times daily before a meal for 14 days. Patient not taking: Reported on 06/18/2020 04/10/19 04/24/19  Wieters, Junius Creamer, PA-C  oseltamivir (TAMIFLU) 75 MG capsule Take 1 capsule (75 mg total) by mouth every 12 (twelve) hours. Patient not taking: Reported on 12/22/2020 10/15/20   Gustavus Bryant, FNP  promethazine-dextromethorphan (PROMETHAZINE-DM) 6.25-15 MG/5ML syrup Take 5 mLs by mouth at bedtime as needed for cough. Patient not taking: Reported on 12/22/2020 06/18/20   Wallis Bamberg, PA-C  pseudoephedrine (SUDAFED) 60 MG tablet Take 1 tablet (60  mg total) by mouth every 8 (eight) hours as needed for congestion. Patient not taking: Reported on 12/22/2020 06/18/20   Wallis Bamberg, PA-C  Tiotropium Bromide Monohydrate (SPIRIVA RESPIMAT) 1.25 MCG/ACT AERS Inhale 2 puffs into the lungs daily. 12/23/20 01/22/21  Ivonne Andrew, NP    Allergies    Patient has no known allergies.  Review of Systems   Review of Systems 10 systems reviewed negative except as per HPI Physical Exam Updated Vital Signs BP 118/81    Pulse (!) 51    Temp 98 F (36.7 C) (Oral)    Resp 16    SpO2 100%   Physical Exam Constitutional:      Appearance: Normal appearance.  HENT:     Head: Normocephalic and atraumatic.     Nose: Nose normal.      Mouth/Throat:     Pharynx: Oropharynx is clear.  Eyes:     Extraocular Movements: Extraocular movements intact.     Pupils: Pupils are equal, round, and reactive to light.  Cardiovascular:     Rate and Rhythm: Normal rate and regular rhythm.     Heart sounds: No murmur heard. Pulmonary:     Effort: Pulmonary effort is normal.     Breath sounds: Normal breath sounds.  Abdominal:     General: There is no distension.     Palpations: Abdomen is soft.     Tenderness: There is no abdominal tenderness. There is no guarding.  Musculoskeletal:        General: No swelling or tenderness. Normal range of motion.     Cervical back: Neck supple.  Skin:    General: Skin is warm and dry.  Neurological:     General: No focal deficit present.     Mental Status: He is alert and oriented to person, place, and time.     Motor: No weakness.     Coordination: Coordination normal.  Psychiatric:        Mood and Affect: Mood normal.    ED Results / Procedures / Treatments   Labs (all labs ordered are listed, but only abnormal results are displayed) Labs Reviewed  COMPREHENSIVE METABOLIC PANEL - Abnormal; Notable for the following components:      Result Value   Total Protein 6.3 (*)    All other components within normal limits  CBC WITH DIFFERENTIAL/PLATELET  MAGNESIUM  URINALYSIS, ROUTINE W REFLEX MICROSCOPIC  TROPONIN I (HIGH SENSITIVITY)  TROPONIN I (HIGH SENSITIVITY)    EKG EKG Interpretation  Date/Time:  Wednesday December 25 2020 12:51:32 EST Ventricular Rate:  63 PR Interval:  126 QRS Duration: 88 QT Interval:  370 QTC Calculation: 378 R Axis:   94 Text Interpretation: Normal sinus rhythm with sinus arrhythmia Rightward axis Borderline ECG no sig change from previous Confirmed by Arby Barrette [phone]) on 12/25/2020 6:54:09 PM  Radiology DG Chest 2 View  Result Date: 12/25/2020 CLINICAL DATA:  Chest pain.  Syncope. EXAM: CHEST - 2 VIEW COMPARISON:  12/22/2020 FINDINGS: Mild  pectus excavatum deformity. Midline trachea.  Normal heart size and mediastinal contours. Sharp costophrenic angles.  No pneumothorax.  Clear lungs. IMPRESSION: No active cardiopulmonary disease. Electronically Signed   By: Jeronimo Greaves M.D.   On: 12/25/2020 14:24   MR BRAIN WO CONTRAST  Result Date: 12/25/2020 CLINICAL DATA:  Seizure, new-onset, no history of trauma; mental status change, unknown cause EXAM: MRI HEAD WITHOUT CONTRAST TECHNIQUE: Multiplanar, multiecho pulse sequences of the brain and surrounding structures were obtained without intravenous contrast.  COMPARISON:  None. FINDINGS: Brain: There is no acute infarction or intracranial hemorrhage. There is no intracranial mass, mass effect, or edema. There is no hydrocephalus or extra-axial fluid collection. Ventricles and sulci are normal in size and configuration. Symmetric and unremarkable hippocampi. Minimal (less than 5) punctate foci of T2 hyperintensity in the frontal subcortical white matter likely reflect nonspecific gliosis/demyelination of doubtful significance. Vascular: Major vessel flow voids at the skull base are preserved. Skull and upper cervical spine: Normal marrow signal is preserved. Sinuses/Orbits: Paranasal sinuses are aerated. Orbits are unremarkable. Other: Sella is unremarkable. Minimal patchy mastoid fluid opacification. IMPRESSION: No significant abnormality. Electronically Signed   By: Guadlupe Spanish M.D.   On: 12/25/2020 17:25    Procedures Procedures   Medications Ordered in ED Medications - No data to display  ED Course  I have reviewed the triage vital signs and the nursing notes.  Pertinent labs & imaging results that were available during my care of the patient were reviewed by me and considered in my medical decision making (see chart for details).    MDM Rules/Calculators/A&P                           Patient returns after initial evaluation 3 days earlier.  Patient is clinically well in  appearance.  He has normal neurologic examination.  Monitor shows sinus rhythm consistently 50s to 60s.  Auscultation is normal without any significant murmur.  Family history negative for any sudden death or early appearing congenital heart conditions.  MRI normal.  With normal MRI and normal neurologic exam, patient stable for continued outpatient evaluation.  No signs of infectious etiology and low suspicion for vascular emergency.  Patient has perceived palpitations and syncope that sounds orthostatic.  Patient reports he has established follow-up with cardiology for further evaluation.  With current EKG, normal troponins and normal chest x-ray. I feel he is stable for continued outpatient management.  Careful return precautions reviewed.  Final Clinical Impression(s) / ED Diagnoses Final diagnoses:  Syncope, unspecified syncope type  Bad headache    Rx / DC Orders ED Discharge Orders     None        Arby Barrette, MD 12/25/20 2108

## 2020-12-25 NOTE — ED Notes (Signed)
Called pt x3 for vitals recheck, no response.  ?

## 2020-12-25 NOTE — ED Notes (Signed)
Called pt for vitals no answer 

## 2021-01-02 ENCOUNTER — Ambulatory Visit (INDEPENDENT_AMBULATORY_CARE_PROVIDER_SITE_OTHER): Payer: Medicaid Other | Admitting: Family Medicine

## 2021-01-02 ENCOUNTER — Other Ambulatory Visit: Payer: Self-pay

## 2021-01-02 ENCOUNTER — Encounter: Payer: Self-pay | Admitting: Family Medicine

## 2021-01-02 VITALS — BP 96/64 | HR 58 | Temp 98.1°F | Resp 16 | Ht 66.0 in | Wt 110.8 lb

## 2021-01-02 DIAGNOSIS — F341 Dysthymic disorder: Secondary | ICD-10-CM

## 2021-01-02 DIAGNOSIS — Z7689 Persons encountering health services in other specified circumstances: Secondary | ICD-10-CM | POA: Diagnosis not present

## 2021-01-02 DIAGNOSIS — F17210 Nicotine dependence, cigarettes, uncomplicated: Secondary | ICD-10-CM

## 2021-01-02 DIAGNOSIS — Z720 Tobacco use: Secondary | ICD-10-CM

## 2021-01-02 NOTE — Progress Notes (Signed)
Patient is here to establish care  Patient is here for f/up from hospital. Patient had a syncope episode and he fell and bump head. Patient has been experiencing back pain and had memory loss. Patient said he is doing a little better.

## 2021-01-02 NOTE — Progress Notes (Signed)
Established Patient Office Visit  Subjective:  Patient ID: Casey Gallagher., male    DOB: 1990/06/17  Age: 30 y.o. MRN: [id number]  CC:  Chief Complaint  Patient presents with   Establish Care    HPI Casey Gallagher. presents for to establish care. Patient had been seen recently in ED with dx of syncope and neuro and cardio work ups were unremarkable. Patient denies recurrence of symptoms.   Past Medical History:  Diagnosis Date   Asthma    Emphysema     Past Surgical History:  Procedure Laterality Date   TONSILLECTOMY      Family History  Problem Relation Age of Onset   Bipolar disorder Mother    Heart failure Mother    Schizophrenia Mother    Heart failure Father    Schizophrenia Father    Bipolar disorder Father     Social History   Socioeconomic History   Marital status: Single    Spouse name: Not on file   Number of children: Not on file   Years of education: Not on file   Highest education level: Not on file  Occupational History   Not on file  Tobacco Use   Smoking status: Every Day    Packs/day: 0.50    Years: 13.00    Pack years: 6.50    Types: Cigarettes   Smokeless tobacco: Never  Vaping Use   Vaping Use: Never used  Substance and Sexual Activity   Alcohol use: Yes    Comment: very rare   Drug use: Yes    Types: Marijuana   Sexual activity: Yes  Other Topics Concern   Not on file  Social History Narrative   Not on file   Social Determinants of Health   Financial Resource Strain: Not on file  Food Insecurity: Not on file  Transportation Needs: Not on file  Physical Activity: Not on file  Stress: Not on file  Social Connections: Not on file  Intimate Partner Violence: Not on file    ROS Review of Systems  Neurological:  Negative for dizziness, seizures, syncope, weakness and headaches.  Psychiatric/Behavioral:  Negative for self-injury, sleep disturbance and suicidal ideas. The patient is not nervous/anxious.   All other systems  reviewed and are negative.  Objective:   Today's Vitals: BP 96/64    Pulse (!) 58    Temp 98.1 F (36.7 C) (Oral)    Resp 16    Ht 5\' 6"  (1.676 m)    Wt 110 lb 12.8 oz (50.3 kg)    SpO2 96%    BMI 17.88 kg/m   Physical Exam Vitals and nursing note reviewed.  Constitutional:      General: He is not in acute distress. HENT:     Head: Normocephalic and atraumatic.  Cardiovascular:     Rate and Rhythm: Normal rate and regular rhythm.  Pulmonary:     Effort: Pulmonary effort is normal.     Breath sounds: Normal breath sounds.  Neurological:     General: No focal deficit present.     Mental Status: He is alert and oriented to person, place, and time.  Psychiatric:        Mood and Affect: Mood normal.        Behavior: Behavior normal.    Assessment & Plan:   1. Dysthymia Patient defers any further eval/management at this time.   2. Tobacco use Discussed reduction/cessation of smoking which is currently at 1/2ppd/  3. Encounter  to establish care     Outpatient Encounter Medications as of 01/02/2021  Medication Sig   albuterol (VENTOLIN HFA) 108 (90 Base) MCG/ACT inhaler Inhale 2 puffs into the lungs every 6 (six) hours as needed for wheezing or shortness of breath.   benzonatate (TESSALON) 100 MG capsule Take 1-2 capsules (100-200 mg total) by mouth 3 (three) times daily as needed for cough.   carbamide peroxide (DEBROX) 6.5 % OTIC solution Place 5 drops into the right ear 2 (two) times daily.   cetirizine (ZYRTEC ALLERGY) 10 MG tablet Take 1 tablet (10 mg total) by mouth daily.   fluticasone (FLONASE) 50 MCG/ACT nasal spray Place 2 sprays into both nostrils daily.   oseltamivir (TAMIFLU) 75 MG capsule Take 1 capsule (75 mg total) by mouth every 12 (twelve) hours.   promethazine-dextromethorphan (PROMETHAZINE-DM) 6.25-15 MG/5ML syrup Take 5 mLs by mouth at bedtime as needed for cough.   pseudoephedrine (SUDAFED) 60 MG tablet Take 1 tablet (60 mg total) by mouth every 8  (eight) hours as needed for congestion.   Tiotropium Bromide Monohydrate (SPIRIVA RESPIMAT) 1.25 MCG/ACT AERS Inhale 2 puffs into the lungs daily.   omeprazole (PRILOSEC) 20 MG capsule Take 1 capsule (20 mg total) by mouth 2 (two) times daily before a meal for 14 days. (Patient not taking: Reported on 06/18/2020)   No facility-administered encounter medications on file as of 01/02/2021.    Follow-up: No follow-ups on file.   Tommie Raymond, MD

## 2021-01-16 ENCOUNTER — Other Ambulatory Visit: Payer: Self-pay | Admitting: Nurse Practitioner

## 2021-01-24 ENCOUNTER — Encounter: Payer: Self-pay | Admitting: Internal Medicine

## 2021-01-24 ENCOUNTER — Other Ambulatory Visit: Payer: Self-pay

## 2021-01-24 ENCOUNTER — Ambulatory Visit (INDEPENDENT_AMBULATORY_CARE_PROVIDER_SITE_OTHER): Payer: Medicaid Other | Admitting: Internal Medicine

## 2021-01-24 VITALS — BP 98/58 | HR 75 | Ht 65.5 in | Wt 112.8 lb

## 2021-01-24 DIAGNOSIS — J45909 Unspecified asthma, uncomplicated: Secondary | ICD-10-CM | POA: Diagnosis not present

## 2021-01-24 DIAGNOSIS — F1290 Cannabis use, unspecified, uncomplicated: Secondary | ICD-10-CM

## 2021-01-24 DIAGNOSIS — Z720 Tobacco use: Secondary | ICD-10-CM | POA: Diagnosis not present

## 2021-01-24 DIAGNOSIS — R55 Syncope and collapse: Secondary | ICD-10-CM

## 2021-01-24 NOTE — Progress Notes (Signed)
Cardiology Office Note:    Date:  01/24/2021   ID:  Casey Pro., DOB Jan 07, 1991, MRN [id number]  PCP:  [name], MD   [facility] Providers Cardiologist:  None     Referring MD: Ivonne Andrew, NP   CC: Follow up from syncope Consulted for the evaluation of syncope at the behest of [name], MD  History of Present Illness:    Casey Gallagher. is a 31 y.o. male with a hx of Asthma and tobacco abuse who presents for evaluation 01/24/21.  Patient notes that he is feeling better have to episodes of passing out..  Had two episodes of passing out.  Notes that he has two episodes of tunnel vision and lightheaded and passed out.  Felt a feeling of deja vu.  Felt some motion sickness.  And vomiting en route to ED.  Had chest tightness during this.Was planned for discharge and had episode of amnesia.  Was kept longer.  Had low heart rate.     Notes that his life is very stressful.  Works all the time and has three special needs children- life is very busy.  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  Sometimes has chest tightness but thinks it may be stress related.  No shortness of breath, chronically has had some DOE with his asthma but this has improved.   No PND or orthopnea.  No weight gain, leg swelling , or abdominal swelling.  Notes  palpitations or funny heart beats with heavy heart beats ~ 5 times in one year; has not them consistently or duhring his syncope.   Past Medical History:  Diagnosis Date   Asthma    Emphysema     Past Surgical History:  Procedure Laterality Date   TONSILLECTOMY      Current Medications: Current Meds  Medication Sig   albuterol (VENTOLIN HFA) 108 (90 Base) MCG/ACT inhaler Inhale 2 puffs into the lungs every 6 (six) hours as needed for wheezing or shortness of breath.   ibuprofen (ADVIL) 800 MG tablet Take 800 mg by mouth every 8 (eight) hours as needed for moderate pain or headache.     Allergies:    Patient has no known allergies.   Social History   Socioeconomic History   Marital status: Single    Spouse name: Not on file   Number of children: Not on file   Years of education: Not on file   Highest education level: Not on file  Occupational History   Not on file  Tobacco Use   Smoking status: Every Day    Packs/day: 0.50    Years: 13.00    Pack years: 6.50    Types: Cigarettes   Smokeless tobacco: Never  Vaping Use   Vaping Use: Never used  Substance and Sexual Activity   Alcohol use: Yes    Comment: very rare   Drug use: Yes    Types: Marijuana   Sexual activity: Yes  Other Topics Concern   Not on file  Social History Narrative   Not on file   Social Determinants of Health   Financial Resource Strain: Not on file  Food Insecurity: Not on file  Transportation Needs: Not on file  Physical Activity: Not on file  Stress: Not on file  Social Connections: Not on file     Family History: The patient's family history includes Bipolar disorder in his father and mother; Heart failure in his father and mother;  Schizophrenia in his father and mother. Sister was born prematurely and may have had heart problems (has heart murmur). Heart failure in the family (biological brother, mother, aunt).  Scattered family history.  ROS:   Please see the history of present illness.     All other systems reviewed and are negative.  EKGs/Labs/Other Studies Reviewed:    The following studies were reviewed today:  EKG:  EKG is  ordered today.  The ekg ordered today demonstrates  12/27/20: SR 63 WNL  Recent Labs: 12/25/2020: ALT 13; BUN 10; Creatinine, Ser 1.01; Hemoglobin 14.5; Magnesium 2.0; Platelets 152; Potassium 4.1; Sodium 138  Recent Lipid Panel No results found for: CHOL, TRIG, HDL, CHOLHDL, VLDL, LDLCALC, LDLDIRECT   Physical Exam:    VS:  BP (!) 98/58    Pulse 75    Ht 5' 5.5" (1.664 m)    Wt 51.2 kg    SpO2 97%    BMI 18.49 kg/m     Wt Readings from Last 3  Encounters:  01/24/21 51.2 kg  01/02/21 50.3 kg  06/28/18 49.9 kg    Gen: no  distress  Neck: No JVD Ears: no Homero Fellers Sign Cardiac: No Rubs or Gallops, no Murmur, regular heart rate +2 radial pulses Respiratory: Clear to auscultation bilaterally, normal effort, normal  respiratory rate GI: Soft, nontender, non-distended  MS: No edema;  moves all extremities Integument: Skin feels warm Neuro:  At time of evaluation, alert and oriented to person/place/time/situation  Psych: Normal affect, patient feels OK   ASSESSMENT:    1. Syncope and collapse   2. Uncomplicated asthma, unspecified asthma severity, unspecified whether persistent   3. Tobacco use   4. Marijuana use    PLAN:    Syncope FHX of heart failure Tobacco and Marijuana abuse- reviewed cessation; presently he has a lot of stress in his life - suspect vasovagal syncope but there was an ED question of chronotropic incompetence - will do POET - had hx of HF that seems early in nature, will get echo - we have discussed pros and cons of additional testing (ziopatch); Shared decision making- sounds as if he may have vasovagal syncope.  If abnormal testing or recurrent symtpoms we will do heart monitor.  Otherwise we will defer  PRN ir return of sx or abnormal testing.       Medication Adjustments/Labs and Tests Ordered: Current medicines are reviewed at length with the patient today.  Concerns regarding medicines are outlined above.  Orders Placed This Encounter  Procedures   Cardiac Stress Test: Informed Consent Details: Physician/Practitioner Attestation; Transcribe to consent form and obtain patient signature   EXERCISE TOLERANCE TEST (ETT)   ECHOCARDIOGRAM COMPLETE   No orders of the defined types were placed in this encounter.   Patient Instructions  Medication Instructions:  Your physician recommends that you continue on your current medications as directed. Please refer to the Current Medication list given to  you today.  *If you need a refill on your cardiac medications before your next appointment, please call your pharmacy*   Lab Work: NONE If you have labs (blood work) drawn today and your tests are completely normal, you will receive your results only by: MyChart Message (if you have MyChart) OR A paper copy in the mail If you have any lab test that is abnormal or we need to change your treatment, we will call you to review the results.   Testing/Procedures: Your physician has requested that you have an echocardiogram. Echocardiography is  a painless test that uses sound waves to create images of your heart. It provides your doctor with information about the size and shape of your heart and how well your hearts chambers and valves are working. This procedure takes approximately one hour. There are no restrictions for this procedure.  Your physician has requested that you have an exercise tolerance test. For further information please visit [email or web address]. Please also follow instruction sheet, as given.   Follow-Up: As needed  At [facility], you and your health needs are our priority.  As part of our continuing mission to provide you with exceptional heart care, we have created designated Provider Care Teams.  These Care Teams include your primary Cardiologist (physician) and Advanced Practice Providers (APPs -  Physician Assistants and Nurse Practitioners) who all work together to provide you with the care you need, when you need it.    Provider:  Riley LamMahesh Reneka Nebergall, MD        Signed, Christell ConstantMahesh A Ayaz Sondgeroth, MD  01/24/2021 11:58 AM    [facility]

## 2021-01-24 NOTE — Patient Instructions (Signed)
Medication Instructions:  Your physician recommends that you continue on your current medications as directed. Please refer to the Current Medication list given to you today.  *If you need a refill on your cardiac medications before your next appointment, please call your pharmacy*   Lab Work: NONE If you have labs (blood work) drawn today and your tests are completely normal, you will receive your results only by: MyChart Message (if you have MyChart) OR A paper copy in the mail If you have any lab test that is abnormal or we need to change your treatment, we will call you to review the results.   Testing/Procedures: Your physician has requested that you have an echocardiogram. Echocardiography is a painless test that uses sound waves to create images of your heart. It provides your doctor with information about the size and shape of your heart and how well your hearts chambers and valves are working. This procedure takes approximately one hour. There are no restrictions for this procedure.  Your physician has requested that you have an exercise tolerance test. For further information please visit [email or web address] Please also follow instruction sheet, as given.   Follow-Up: As needed  At [facility], you and your health needs are our priority.  As part of our continuing mission to provide you with exceptional heart care, we have created designated Provider Care Teams.  These Care Teams include your primary Cardiologist (physician) and Advanced Practice Providers (APPs -  Physician Assistants and Nurse Practitioners) who all work together to provide you with the care you need, when you need it.    Provider:  Riley Lam, MD

## 2021-02-11 ENCOUNTER — Ambulatory Visit (HOSPITAL_COMMUNITY): Payer: Medicaid Other | Attending: Internal Medicine

## 2021-02-11 ENCOUNTER — Other Ambulatory Visit: Payer: Self-pay

## 2021-02-11 ENCOUNTER — Ambulatory Visit (INDEPENDENT_AMBULATORY_CARE_PROVIDER_SITE_OTHER): Payer: Medicaid Other

## 2021-02-11 DIAGNOSIS — R55 Syncope and collapse: Secondary | ICD-10-CM

## 2021-02-11 LAB — EXERCISE TOLERANCE TEST
Angina Index: 0
Duke Treadmill Score: 13
Estimated workload: 14.6
Exercise duration (min): 12 min
Exercise duration (sec): 40 s
MPHR: 190 {beats}/min
Peak HR: 190 {beats}/min
Percent HR: 85 %
Rest HR: 57 {beats}/min
ST Depression (mm): 0 mm

## 2021-02-12 LAB — ECHOCARDIOGRAM COMPLETE
Area-P 1/2: 3.42 cm2
Height: 65.5 in
S' Lateral: 2.9 cm
Weight: 1792 oz

## 2021-02-17 ENCOUNTER — Other Ambulatory Visit: Payer: Self-pay

## 2021-02-17 ENCOUNTER — Encounter: Payer: Self-pay | Admitting: Family Medicine

## 2021-02-17 ENCOUNTER — Ambulatory Visit (INDEPENDENT_AMBULATORY_CARE_PROVIDER_SITE_OTHER): Payer: Medicaid Other | Admitting: Family Medicine

## 2021-02-17 VITALS — BP 117/75 | HR 63 | Temp 98.1°F | Resp 16 | Wt 118.6 lb

## 2021-02-17 DIAGNOSIS — Z0000 Encounter for general adult medical examination without abnormal findings: Secondary | ICD-10-CM

## 2021-02-17 DIAGNOSIS — Z23 Encounter for immunization: Secondary | ICD-10-CM | POA: Diagnosis not present

## 2021-02-17 NOTE — Progress Notes (Signed)
Patient is here for CPE.  Patient said he is doing well with no new concerns for provider today

## 2021-02-18 ENCOUNTER — Encounter: Payer: Self-pay | Admitting: Family Medicine

## 2021-02-18 LAB — CBC WITH DIFFERENTIAL/PLATELET
Basophils Absolute: 0 10*3/uL (ref 0.0–0.2)
Basos: 1 %
EOS (ABSOLUTE): 0.2 10*3/uL (ref 0.0–0.4)
Eos: 3 %
Hematocrit: 43.2 % (ref 37.5–51.0)
Hemoglobin: 14.7 g/dL (ref 13.0–17.7)
Immature Grans (Abs): 0 10*3/uL (ref 0.0–0.1)
Immature Granulocytes: 0 %
Lymphocytes Absolute: 1.8 10*3/uL (ref 0.7–3.1)
Lymphs: 31 %
MCH: 31 pg (ref 26.6–33.0)
MCHC: 34 g/dL (ref 31.5–35.7)
MCV: 91 fL (ref 79–97)
Monocytes Absolute: 0.4 10*3/uL (ref 0.1–0.9)
Monocytes: 7 %
Neutrophils Absolute: 3.2 10*3/uL (ref 1.4–7.0)
Neutrophils: 58 %
Platelets: 156 10*3/uL (ref 150–450)
RBC: 4.74 x10E6/uL (ref 4.14–5.80)
RDW: 12.6 % (ref 11.6–15.4)
WBC: 5.6 10*3/uL (ref 3.4–10.8)

## 2021-02-18 LAB — CMP14+EGFR
ALT: 8 IU/L (ref 0–44)
AST: 17 IU/L (ref 0–40)
Albumin/Globulin Ratio: 2.9 — ABNORMAL HIGH (ref 1.2–2.2)
Albumin: 4.6 g/dL (ref 4.1–5.2)
Alkaline Phosphatase: 65 IU/L (ref 44–121)
BUN/Creatinine Ratio: 10 (ref 9–20)
BUN: 11 mg/dL (ref 6–20)
Bilirubin Total: 0.3 mg/dL (ref 0.0–1.2)
CO2: 25 mmol/L (ref 20–29)
Calcium: 9.2 mg/dL (ref 8.7–10.2)
Chloride: 104 mmol/L (ref 96–106)
Creatinine, Ser: 1.06 mg/dL (ref 0.76–1.27)
Globulin, Total: 1.6 g/dL (ref 1.5–4.5)
Glucose: 91 mg/dL (ref 70–99)
Potassium: 4.5 mmol/L (ref 3.5–5.2)
Sodium: 143 mmol/L (ref 134–144)
Total Protein: 6.2 g/dL (ref 6.0–8.5)
eGFR: 97 mL/min/{1.73_m2} (ref >59)

## 2021-02-18 LAB — LIPID PANEL
Chol/HDL Ratio: 3 ratio (ref 0.0–5.0)
Cholesterol, Total: 125 mg/dL (ref 100–199)
HDL: 41 mg/dL (ref >39)
LDL Chol Calc (NIH): 72 mg/dL (ref 0–99)
Triglycerides: 51 mg/dL (ref 0–149)
VLDL Cholesterol Cal: 12 mg/dL (ref 5–40)

## 2021-02-18 NOTE — Progress Notes (Signed)
New Patient Office Visit  Subjective:  Patient ID: Casey Gallagher., male    DOB: [birth year]  Age: 31 y.o. MRN: [id number]  CC:  Chief Complaint  Patient presents with   Annual Exam    HPI Chaska Hagger. presents for routine annual exam. Patient denies acute complaints or concerns.   Past Medical History:  Diagnosis Date   Asthma    Emphysema     Past Surgical History:  Procedure Laterality Date   TONSILLECTOMY      Family History  Problem Relation Age of Onset   Bipolar disorder Mother    Heart failure Mother    Schizophrenia Mother    Heart failure Father    Schizophrenia Father    Bipolar disorder Father     Social History   Socioeconomic History   Marital status: Single    Spouse name: Not on file   Number of children: Not on file   Years of education: Not on file   Highest education level: Not on file  Occupational History   Not on file  Tobacco Use   Smoking status: Every Day    Packs/day: 0.50    Years: 13.00    Pack years: 6.50    Types: Cigarettes   Smokeless tobacco: Never  Vaping Use   Vaping Use: Never used  Substance and Sexual Activity   Alcohol use: Yes    Comment: very rare   Drug use: Yes    Types: Marijuana   Sexual activity: Yes  Other Topics Concern   Not on file  Social History Narrative   Not on file   Social Determinants of Health   Financial Resource Strain: Not on file  Food Insecurity: Not on file  Transportation Needs: Not on file  Physical Activity: Not on file  Stress: Not on file  Social Connections: Not on file  Intimate Partner Violence: Not on file    ROS Review of Systems  All other systems reviewed and are negative.  Objective:   Today's Vitals: BP 117/75    Pulse 63    Temp 98.1 F (36.7 C) (Oral)    Resp 16    Wt 118 lb 9.6 oz (53.8 kg)    SpO2 95%    BMI 19.44 kg/m   Physical Exam Vitals and nursing note reviewed.  Constitutional:      General: He is not in acute distress. HENT:      Head: Normocephalic and atraumatic.     Right Ear: Tympanic membrane, ear canal and external ear normal.     Left Ear: Tympanic membrane, ear canal and external ear normal.     Nose: Nose normal.     Mouth/Throat:     Mouth: Mucous membranes are moist.     Pharynx: Oropharynx is clear.  Eyes:     Conjunctiva/sclera: Conjunctivae normal.     Pupils: Pupils are equal, round, and reactive to light.  Neck:     Thyroid: No thyromegaly.  Cardiovascular:     Rate and Rhythm: Normal rate and regular rhythm.     Heart sounds: Normal heart sounds. No murmur heard. Pulmonary:     Effort: Pulmonary effort is normal.     Breath sounds: Normal breath sounds.  Abdominal:     General: There is no distension.     Palpations: Abdomen is soft. There is no mass.     Tenderness: There is no abdominal tenderness.     Hernia: There is no hernia  in the left inguinal area or right inguinal area.  Genitourinary:    Penis: Normal.      Testes: Normal.  Musculoskeletal:        General: Normal range of motion.     Cervical back: Normal range of motion and neck supple.     Right lower leg: No edema.     Left lower leg: No edema.  Skin:    General: Skin is warm and dry.  Neurological:     General: No focal deficit present.     Mental Status: He is alert and oriented to person, place, and time. Mental status is at baseline.  Psychiatric:        Mood and Affect: Mood normal.        Behavior: Behavior normal.    Assessment & Plan:   1. Annual physical exam Routine labs ordered.   - CMP14+EGFR - CBC with Differential - Lipid Panel  2. Need for influenza vaccination  - Flu Vaccine QUAD 41mo+IM (Fluarix, Fluzone & Alfiuria Quad PF)    Outpatient Encounter Medications as of 02/17/2021  Medication Sig   albuterol (VENTOLIN HFA) 108 (90 Base) MCG/ACT inhaler Inhale 2 puffs into the lungs every 6 (six) hours as needed for wheezing or shortness of breath.   ibuprofen (ADVIL) 800 MG tablet Take 800 mg by  mouth every 8 (eight) hours as needed for moderate pain or headache.   No facility-administered encounter medications on file as of 02/17/2021.    Follow-up: No follow-ups on file.   Becky Sax, MD

## 2021-06-05 ENCOUNTER — Ambulatory Visit (INDEPENDENT_AMBULATORY_CARE_PROVIDER_SITE_OTHER): Payer: Medicaid Other

## 2021-06-05 ENCOUNTER — Encounter (HOSPITAL_COMMUNITY): Payer: Self-pay | Admitting: Emergency Medicine

## 2021-06-05 ENCOUNTER — Ambulatory Visit (HOSPITAL_COMMUNITY)
Admission: EM | Admit: 2021-06-05 | Discharge: 2021-06-05 | Disposition: A | Discharge status: Home / Self Care | Payer: Medicaid Other | Attending: Family Medicine | Admitting: Family Medicine

## 2021-06-05 DIAGNOSIS — S20212A Contusion of left front wall of thorax, initial encounter: Secondary | ICD-10-CM

## 2021-06-05 DIAGNOSIS — R079 Chest pain, unspecified: Secondary | ICD-10-CM

## 2021-06-05 MED ORDER — DICLOFENAC SODIUM 75 MG PO TBEC: 75.0000 mg | DELAYED_RELEASE_TABLET | Freq: Two times a day (BID) | ORAL | 0 refills | Status: DC

## 2021-06-05 MED ORDER — HYDROCODONE-ACETAMINOPHEN 5-325 MG PO TABS: 1.0000 | ORAL_TABLET | Freq: Four times a day (QID) | ORAL | 0 refills | Status: DC | PRN
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
## 2021-06-05 NOTE — Discharge Instructions (Addendum)
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
## 2021-08-18 ENCOUNTER — Ambulatory Visit: Payer: Medicaid Other | Admitting: Family Medicine

## 2021-10-16 ENCOUNTER — Other Ambulatory Visit: Payer: Self-pay

## 2021-10-16 ENCOUNTER — Ambulatory Visit
Admission: RE | Admit: 2021-10-16 | Discharge: 2021-10-16 | Disposition: A | Discharge status: Home / Self Care | Payer: Medicaid Other | Source: Ambulatory Visit | Attending: Physician Assistant | Admitting: Physician Assistant

## 2021-10-16 VITALS — BP 105/69 | HR 62 | Temp 98.0°F | Resp 18

## 2021-10-16 DIAGNOSIS — M25512 Pain in left shoulder: Secondary | ICD-10-CM

## 2021-10-16 MED ORDER — CYCLOBENZAPRINE HCL 10 MG PO TABS: 10.0000 mg | ORAL_TABLET | Freq: Two times a day (BID) | ORAL | 0 refills | Status: DC | PRN

## 2021-10-16 MED ORDER — PREDNISONE 20 MG PO TABS: 40.0000 mg | ORAL_TABLET | Freq: Every day | ORAL | 0 refills | Status: AC

## 2021-10-16 NOTE — ED Triage Notes (Signed)
Pt here for left neck and shoulder pain and tightness; pt sts started 5 days ago; denies injury

## 2021-10-16 NOTE — ED Provider Notes (Signed)
[facility]    CSN: [id number] Arrival date & time: 10/16/21  1014      History   Chief Complaint Chief Complaint  Patient presents with   Shoulder Pain    LEFT SHOULDER AND ARM PAIN. FEELS LIKE MUSCLE PAIN - Entered by patient    HPI Casey Gallagher. is a 31 y.o. male.   Patient here today for evaluation of left shoulder and arm pain.  He reports that symptoms started 5 days ago.  He did not have any injury but states he might of slept on his shoulder wrong.  He has not taken any medication for symptoms.  He reports occasionally pain will radiate down his arm and into his hand.  He has had some tingling as well when this occurs.  He notes that movement does seem to make pain worse.  He denies any chest pain or shortness of breath.  The history is provided by the patient.  Shoulder Pain Associated symptoms: no fever     Past Medical History:  Diagnosis Date   Asthma    Emphysema     Patient Active Problem List   Diagnosis Date Noted   Syncope and collapse 12/22/2020   Asthma 12/22/2020   Tobacco use 12/22/2020   Marijuana use 12/22/2020    Past Surgical History:  Procedure Laterality Date   TONSILLECTOMY         Home Medications    Prior to Admission medications   Medication Sig Start Date End Date Taking? Authorizing Provider  cyclobenzaprine (FLEXERIL) 10 MG tablet Take 1 tablet (10 mg total) by mouth 2 (two) times daily as needed for muscle spasms. 10/16/21  Yes Francene Finders, PA-C  predniSONE (DELTASONE) 20 MG tablet Take 2 tablets (40 mg total) by mouth daily with breakfast for 5 days. 10/16/21 10/21/21 Yes Francene Finders, PA-C  albuterol (VENTOLIN HFA) 108 (90 Base) MCG/ACT inhaler Inhale 2 puffs into the lungs every 6 (six) hours as needed for wheezing or shortness of breath. 12/23/20   Fenton Foy, NP  diclofenac (VOLTAREN) 75 MG EC tablet Take 1 tablet (75 mg total) by mouth 2 (two) times daily. Patient not taking: Reported on  10/16/2021 06/05/21   Vanessa Kick, MD  HYDROcodone-acetaminophen (NORCO/VICODIN) 5-325 MG tablet Take 1 tablet by mouth every 6 (six) hours as needed for moderate pain or severe pain. Patient not taking: Reported on 10/16/2021 06/05/21   Vanessa Kick, MD  ibuprofen (ADVIL) 800 MG tablet Take 800 mg by mouth every 8 (eight) hours as needed for moderate pain or headache.    [provider]    Family History Family History  Problem Relation Age of Onset   Bipolar disorder Mother    Heart failure Mother    Schizophrenia Mother    Heart failure Father    Schizophrenia Father    Bipolar disorder Father     Social History Social History   Tobacco Use   Smoking status: Every Day    Packs/day: 0.50    Years: 13.00    Total pack years: 6.50    Types: Cigarettes   Smokeless tobacco: Never  Vaping Use   Vaping Use: Never used  Substance Use Topics   Alcohol use: Yes    Comment: very rare   Drug use: Yes    Types: Marijuana     Allergies   Patient has no known allergies.   Review of Systems Review of Systems  Constitutional:  Negative for chills  and fever.  Eyes:  Negative for discharge and redness.  Respiratory:  Negative for shortness of breath.   Cardiovascular:  Negative for chest pain.  Musculoskeletal:  Positive for arthralgias and myalgias.  Neurological:  Negative for weakness, light-headedness and numbness.     Physical Exam Triage Vital Signs ED Triage Vitals [10/16/21 1054]  Enc Vitals Group     BP 105/69     Pulse Rate 62     Resp 18     Temp 98 F (36.7 C)     Temp Source Oral     SpO2 97 %     Weight      Height      Head Circumference      Peak Flow      Pain Score 7     Pain Loc      Pain Edu?      Excl. in GC?    No data found.  Updated Vital Signs BP 105/69 (BP Location: Left Arm)   Pulse 62   Temp 98 F (36.7 C) (Oral)   Resp 18   SpO2 97%      Physical Exam Vitals and nursing note reviewed.  Constitutional:       General: He is not in acute distress.    Appearance: Normal appearance. He is not ill-appearing.  HENT:     Head: Normocephalic and atraumatic.  Eyes:     Conjunctiva/sclera: Conjunctivae normal.  Cardiovascular:     Rate and Rhythm: Normal rate.  Pulmonary:     Effort: Pulmonary effort is normal.  Musculoskeletal:     Comments: Decreased range of motion of left shoulder in all directions due to pain.  Tenderness to palpation noted to shoulder anteriorly, laterally and posteriorly diffusely.  Neurological:     Mental Status: He is alert.     Comments: Grip strength 5 out of 5 bilaterally  Psychiatric:        Mood and Affect: Mood normal.        Behavior: Behavior normal.        Thought Content: Thought content normal.      UC Treatments / Results  Labs (all labs ordered are listed, but only abnormal results are displayed) Labs Reviewed - No data to display  EKG   Radiology No results found.  Procedures Procedures (including critical care time)  Medications Ordered in UC Medications - No data to display  Initial Impression / Assessment and Plan / UC Course  I have reviewed the triage vital signs and the nursing notes.  Pertinent labs & imaging results that were available during my care of the patient were reviewed by me and considered in my medical decision making (see chart for details).    We will treat to cover muscle strain with steroid burst and muscle relaxer.  Recommended follow-up with Ortho if no gradual improvement with treatment.  Final Clinical Impressions(s) / UC Diagnoses   Final diagnoses:  Acute pain of left shoulder   Discharge Instructions   None    ED Prescriptions     Medication Sig Dispense Auth. Provider   predniSONE (DELTASONE) 20 MG tablet Take 2 tablets (40 mg total) by mouth daily with breakfast for 5 days. 10 tablet Erma Pinto F, PA-C   cyclobenzaprine (FLEXERIL) 10 MG tablet Take 1 tablet (10 mg total) by mouth 2 (two) times  daily as needed for muscle spasms. 20 tablet Tomi Bamberger, PA-C      PDMP not reviewed  this encounter.   Tomi Bamberger, PA-C 10/16/21 1150

## 2022-06-21 ENCOUNTER — Other Ambulatory Visit: Payer: Self-pay

## 2022-06-21 ENCOUNTER — Ambulatory Visit
Admission: EM | Admit: 2022-06-21 | Discharge: 2022-06-21 | Disposition: A | Discharge status: Home / Self Care | Payer: Medicaid Other | Attending: Family Medicine | Admitting: Family Medicine

## 2022-06-21 ENCOUNTER — Encounter: Payer: Self-pay | Admitting: Emergency Medicine

## 2022-06-21 DIAGNOSIS — Z1152 Encounter for screening for COVID-19: Secondary | ICD-10-CM | POA: Diagnosis not present

## 2022-06-21 DIAGNOSIS — J4521 Mild intermittent asthma with (acute) exacerbation: Secondary | ICD-10-CM | POA: Insufficient documentation

## 2022-06-21 DIAGNOSIS — B9789 Other viral agents as the cause of diseases classified elsewhere: Secondary | ICD-10-CM | POA: Insufficient documentation

## 2022-06-21 DIAGNOSIS — J069 Acute upper respiratory infection, unspecified: Secondary | ICD-10-CM | POA: Insufficient documentation

## 2022-06-21 MED ORDER — PREDNISONE 20 MG PO TABS: 40.0000 mg | ORAL_TABLET | Freq: Every day | ORAL | 0 refills | Status: AC

## 2022-06-21 MED ORDER — ALBUTEROL SULFATE HFA 108 (90 BASE) MCG/ACT IN AERS: 2.0000 | INHALATION_SPRAY | RESPIRATORY_TRACT | 0 refills | Status: AC | PRN

## 2022-06-21 MED ORDER — BENZONATATE 100 MG PO CAPS: 100.0000 mg | ORAL_CAPSULE | Freq: Three times a day (TID) | ORAL | 0 refills | Status: DC | PRN

## 2022-06-21 NOTE — ED Provider Notes (Signed)
[facility]    CSN: [id number] Arrival date & time: 06/21/22  1444      History   Chief Complaint Chief Complaint  Patient presents with   URI    HPI Casey Gallagher. is a 32 y.o. male.    URI  Here for cough and postnasal drainage.  Symptoms began on June.  He is also had some chest tightness and wheezing.  He does have a history of asthma  No fever or chills.  He has had some sore throat  No vomiting but some diarrhea is present.  He states that actually started before cold symptoms  No allergies to medications  Past Medical History:  Diagnosis Date   Asthma    Emphysema     Patient Active Problem List   Diagnosis Date Noted   Syncope and collapse 12/22/2020   Asthma 12/22/2020   Tobacco use 12/22/2020   Marijuana use 12/22/2020    Past Surgical History:  Procedure Laterality Date   TONSILLECTOMY         Home Medications    Prior to Admission medications   Medication Sig Start Date End Date Taking? Authorizing Provider  albuterol (VENTOLIN HFA) 108 (90 Base) MCG/ACT inhaler Inhale 2 puffs into the lungs every 4 (four) hours as needed for wheezing or shortness of breath. 06/21/22  Yes Jyssica Rief, Janace Aris, MD  benzonatate (TESSALON) 100 MG capsule Take 1 capsule (100 mg total) by mouth 3 (three) times daily as needed for cough. 06/21/22  Yes Zenia Resides, MD  predniSONE (DELTASONE) 20 MG tablet Take 2 tablets (40 mg total) by mouth daily with breakfast for 5 days. 06/21/22 06/26/22 Yes Taray Normoyle, Janace Aris, MD  cyclobenzaprine (FLEXERIL) 10 MG tablet Take 1 tablet (10 mg total) by mouth 2 (two) times daily as needed for muscle spasms. 10/16/21   Tomi Bamberger, PA-C  ibuprofen (ADVIL) 800 MG tablet Take 800 mg by mouth every 8 (eight) hours as needed for moderate pain or headache.    [provider]    Family History Family History  Problem Relation Age of Onset   Bipolar disorder Mother    Heart failure Mother    Schizophrenia  Mother    Heart failure Father    Schizophrenia Father    Bipolar disorder Father     Social History Social History   Tobacco Use   Smoking status: Every Day    Packs/day: 0.50    Years: 13.00    Additional pack years: 0.00    Total pack years: 6.50    Types: Cigarettes   Smokeless tobacco: Never  Vaping Use   Vaping Use: Never used  Substance Use Topics   Alcohol use: Yes    Comment: very rare   Drug use: Yes    Types: Marijuana     Allergies   Patient has no known allergies.   Review of Systems Review of Systems   Physical Exam Triage Vital Signs ED Triage Vitals [06/21/22 1509]  Enc Vitals Group     BP 98/60     Pulse Rate 69     Resp 18     Temp 98.3 F (36.8 C)     Temp Source Oral     SpO2 98 %     Weight      Height      Head Circumference      Peak Flow      Pain Score 5     Pain Loc  Pain Edu?      Excl. in GC?    No data found.  Updated Vital Signs BP 98/60 (BP Location: Left Arm)   Pulse 69   Temp 98.3 F (36.8 C) (Oral)   Resp 18   SpO2 98%   Visual Acuity Right Eye Distance:   Left Eye Distance:   Bilateral Distance:    Right Eye Near:   Left Eye Near:    Bilateral Near:     Physical Exam Vitals reviewed.  Constitutional:      General: He is not in acute distress.    Appearance: He is not ill-appearing, toxic-appearing or diaphoretic.  HENT:     Right Ear: Tympanic membrane normal.     Left Ear: Tympanic membrane normal.     Nose: Nose normal.     Mouth/Throat:     Mouth: Mucous membranes are moist.     Pharynx: No oropharyngeal exudate or posterior oropharyngeal erythema.  Eyes:     Extraocular Movements: Extraocular movements intact.     Conjunctiva/sclera: Conjunctivae normal.     Pupils: Pupils are equal, round, and reactive to light.  Cardiovascular:     Rate and Rhythm: Normal rate and regular rhythm.     Heart sounds: No murmur heard. Pulmonary:     Effort: Pulmonary effort is normal. No respiratory  distress.     Breath sounds: No stridor. No rhonchi or rales.     Comments: Air movement is good.  There are end expiratory wheezes. Musculoskeletal:     Cervical back: Neck supple.  Lymphadenopathy:     Cervical: No cervical adenopathy.  Skin:    Capillary Refill: Capillary refill takes less than 2 seconds.     Coloration: Skin is not jaundiced or pale.  Neurological:     General: No focal deficit present.     Mental Status: He is alert and oriented to person, place, and time.  Psychiatric:        Behavior: Behavior normal.      UC Treatments / Results  Labs (all labs ordered are listed, but only abnormal results are displayed) Labs Reviewed  SARS CORONAVIRUS 2 (TAT 6-24 HRS)    EKG   Radiology No results found.  Procedures Procedures (including critical care time)  Medications Ordered in UC Medications - No data to display  Initial Impression / Assessment and Plan / UC Course  I have reviewed the triage vital signs and the nursing notes.  Pertinent labs & imaging results that were available during my care of the patient were reviewed by me and considered in my medical decision making (see chart for details).       Albuterol and prednisone are sent in for the asthma exacerbation.  Tessalon Perles are sent in for cough.   COVID swab was done and if positive, he is a candidate for Paxlovid.  His last EGFR in 2023 was 97.  Here forFinal Clinical Impressions(s) / UC Diagnoses   Final diagnoses:  Viral URI  Mild intermittent asthma with acute exacerbation     Discharge Instructions      Albuterol inhaler--do 2 puffs every 4 hours as needed for shortness of breath or wheezing  Take prednisone 20 mg--2 daily for 5 days  Take benzonatate 100 mg, 1 tab every 8 hours as needed for cough.   You have been swabbed for COVID, and the test will result in the next 24 hours. Our staff will call you if positive. If the COVID test  is positive, you should quarantine  until you are fever free for 24 hours and you are starting to feel better, and then take added precautions for the next 5 days, such as physical distancing/wearing a mask and good hand hygiene/washing.       ED Prescriptions     Medication Sig Dispense Auth. Provider   albuterol (VENTOLIN HFA) 108 (90 Base) MCG/ACT inhaler Inhale 2 puffs into the lungs every 4 (four) hours as needed for wheezing or shortness of breath. 1 each Zenia Resides, MD   predniSONE (DELTASONE) 20 MG tablet Take 2 tablets (40 mg total) by mouth daily with breakfast for 5 days. 10 tablet Zenia Resides, MD   benzonatate (TESSALON) 100 MG capsule Take 1 capsule (100 mg total) by mouth 3 (three) times daily as needed for cough. 21 capsule Zenia Resides, MD      PDMP not reviewed this encounter.   Zenia Resides, MD 06/21/22 [phone]

## 2022-06-21 NOTE — ED Triage Notes (Signed)
Pt here for URI sx with cough and congestion x 3 days

## 2022-06-21 NOTE — Discharge Instructions (Signed)
Albuterol inhaler--do 2 puffs every 4 hours as needed for shortness of breath or wheezing  Take prednisone 20 mg--2 daily for 5 days  Take benzonatate 100 mg, 1 tab every 8 hours as needed for cough.   You have been swabbed for COVID, and the test will result in the next 24 hours. Our staff will call you if positive. If the COVID test is positive, you should quarantine until you are fever free for 24 hours and you are starting to feel better, and then take added precautions for the next 5 days, such as physical distancing/wearing a mask and good hand hygiene/washing.   

## 2022-06-23 LAB — SARS CORONAVIRUS 2 (TAT 6-24 HRS): SARS Coronavirus 2: NEGATIVE

## 2022-07-19 ENCOUNTER — Encounter (HOSPITAL_COMMUNITY): Payer: Self-pay | Admitting: *Deleted

## 2022-07-19 ENCOUNTER — Emergency Department (HOSPITAL_COMMUNITY)
Admission: EM | Admit: 2022-07-19 | Discharge: 2022-07-19 | Disposition: A | Discharge status: Home / Self Care | Payer: Medicaid Other | Attending: Emergency Medicine | Admitting: Emergency Medicine

## 2022-07-19 ENCOUNTER — Other Ambulatory Visit: Payer: Self-pay

## 2022-07-19 DIAGNOSIS — J45909 Unspecified asthma, uncomplicated: Secondary | ICD-10-CM | POA: Insufficient documentation

## 2022-07-19 DIAGNOSIS — H6122 Impacted cerumen, left ear: Secondary | ICD-10-CM | POA: Insufficient documentation

## 2022-07-19 DIAGNOSIS — H9201 Otalgia, right ear: Secondary | ICD-10-CM

## 2022-07-19 MED ORDER — CARBAMIDE PEROXIDE 6.5 % OT SOLN
5.0000 [drp] | Freq: Once | OTIC | Status: DC
  Filled 2022-07-19: qty 15

## 2022-07-19 MED ORDER — IBUPROFEN 800 MG PO TABS
800.0000 mg | ORAL_TABLET | Freq: Once | ORAL | Status: AC
Start: 2022-07-19 — End: 2022-07-19
  Administered 2022-07-19: 800 mg via ORAL
  Filled 2022-07-19: qty 1

## 2022-07-19 NOTE — ED Provider Notes (Signed)
[place] EMERGENCY DEPARTMENT AT [facility] Provider Note   CSN: [id number] Arrival date & time: 07/19/22  1747     History  Chief Complaint  Patient presents with   Otalgia    Casey Gallagher. is a 32 y.o. male.  With history of asthma, tobacco use who presents to the ED for evaluation of right ear pain. He has a history of right ear issues secondary to cerumen build up. He has seen ENT for this. He had progressive right ear pain and hearing loss over the last couple of days. He also has pain to the right side of his face. He has had congestion for a while. His hearing loss has improved and is now described as muffled hearing.  He states he can "feel something swishing around in there."  He denies any tinnitus.  He states he has been up for 48 hours due to life stressors.  He denies any headaches or fevers.   Otalgia      Home Medications Prior to Admission medications   Medication Sig Start Date End Date Taking? Authorizing Provider  albuterol (VENTOLIN HFA) 108 (90 Base) MCG/ACT inhaler Inhale 2 puffs into the lungs every 4 (four) hours as needed for wheezing or shortness of breath. 06/21/22   Zenia Resides, MD  benzonatate (TESSALON) 100 MG capsule Take 1 capsule (100 mg total) by mouth 3 (three) times daily as needed for cough. 06/21/22   Zenia Resides, MD  cyclobenzaprine (FLEXERIL) 10 MG tablet Take 1 tablet (10 mg total) by mouth 2 (two) times daily as needed for muscle spasms. 10/16/21   Tomi Bamberger, PA-C  ibuprofen (ADVIL) 800 MG tablet Take 800 mg by mouth every 8 (eight) hours as needed for moderate pain or headache.    [provider]      Allergies    Patient has no known allergies.    Review of Systems   Review of Systems  HENT:  Positive for ear pain.   All other systems reviewed and are negative.   Physical Exam Updated Vital Signs BP 122/72 (BP Location: Right Arm)   Pulse 67   Temp 98.2 F (36.8 C) (Oral)   Resp 18   Ht  5' 0.5" (1.537 m)   Wt 53.8 kg   SpO2 100%   BMI 22.78 kg/m  Physical Exam Vitals and nursing note reviewed.  Constitutional:      General: He is not in acute distress.    Appearance: Normal appearance. He is normal weight. He is not ill-appearing.  HENT:     Head: Normocephalic and atraumatic.     Left Ear: There is impacted cerumen.     Ears:     Comments: Left canal with cerumen impaction. Right canal with moderate amount of cerumen. Visualized TM is without injection, perforation, erythema, bulging. No mastoid bulging, erythema or tenderness bilaterally.     Nose: Congestion present. No rhinorrhea.     Mouth/Throat:     Mouth: Mucous membranes are moist.     Pharynx: Oropharynx is clear. No oropharyngeal exudate or posterior oropharyngeal erythema.  Eyes:     Comments: Bilateral conjunctival injection  Pulmonary:     Effort: Pulmonary effort is normal. No respiratory distress.  Abdominal:     General: Abdomen is flat.  Musculoskeletal:        General: Normal range of motion.     Cervical back: Neck supple.  Skin:    General: Skin is warm  and dry.  Neurological:     Mental Status: He is alert and oriented to person, place, and time.  Psychiatric:        Mood and Affect: Mood normal.        Behavior: Behavior normal.     ED Results / Procedures / Treatments   Labs (all labs ordered are listed, but only abnormal results are displayed) Labs Reviewed - No data to display  EKG None  Radiology No results found.  Procedures Procedures    Medications Ordered in ED Medications  ibuprofen (ADVIL) tablet 800 mg (800 mg Oral Given 07/19/22 2044)    ED Course/ Medical Decision Making/ A&P                             Medical Decision Making This patient presents to the ED for concern of right ear pain, muffled hearing, this involves an extensive number of treatment options, and is a complaint that carries with it a high risk of complications and morbidity.  The  differential diagnosis includes otitis media, otitis externa, cholesteatoma, mastoiditis, eustachian tube dysfunction  My initial workup includes cerumen disimpaction, pain control  Additional history obtained from: Nursing notes from this visit.  Afebrile, hemodynamically stable.  32 year old male presenting to the ED for evaluation of right ear pain.  He also reports some difficulty hearing out of the right ear.  He has a history of similar due to cerumen impaction.  On exam, he does have moderate amount of cerumen in the right canal, however I am still able to visualize the tympanic membrane which appears normal.  I performed a cerumen disimpaction in the emergency department and patient reported significant improvement in his symptoms.  Overall I do suspect that his symptoms are secondary to eustachian tube dysfunction.  He was encouraged to alternate Tylenol and ibuprofen at home for pain and to use Claritin and Flonase for his congestion and ear fullness.  He was given contact information for ENT and encouraged to follow-up.  He was given return precautions.  Stable at discharge.  At this time there does not appear to be any evidence of an acute emergency medical condition and the patient appears stable for discharge with appropriate outpatient follow up. Diagnosis was discussed with patient who verbalizes understanding of care plan and is agreeable to discharge. I have discussed return precautions with patient who verbalizes understanding. Patient encouraged to follow-up with ENT within 1 week. All questions answered.  Note: Portions of this report may have been transcribed using voice recognition software. Every effort was made to ensure accuracy; however, inadvertent computerized transcription errors may still be present.        Final Clinical Impression(s) / ED Diagnoses Final diagnoses:  Otalgia of right ear    Rx / DC Orders ED Discharge Orders     None         Mora Bellman 07/19/22 2050    Gloris Manchester, MD 07/19/22 2134

## 2022-07-19 NOTE — Discharge Instructions (Signed)
You have been seen today for your complaint of ear pain. Your discharge medications include claritin and flonase. These are over the counter medications used to help with your symptoms. Take them as prescribed and daily until your symptoms resolve. Follow up with: Dr. Jearld Fenton. He is an ENT doctor. Call tomorrow to schedule a follow up visit.  Please seek immediate medical care if you develop any of the following symptoms: You have a severe headache. You have a stiff neck. You have trouble swallowing. You have redness or swelling behind your ear. You have fluid or blood coming from your ear. You feel dizzy. At this time there does not appear to be the presence of an emergent medical condition, however there is always the potential for conditions to change. Please read and follow the below instructions.  Do not take your medicine if  develop an itchy rash, swelling in your mouth or lips, or difficulty breathing; call 911 and seek immediate emergency medical attention if this occurs.  You may review your lab tests and imaging results in their entirety on your MyChart account.  Please discuss all results of fully with your primary care provider and other specialist at your follow-up visit.  Note: Portions of this text may have been transcribed using voice recognition software. Every effort was made to ensure accuracy; however, inadvertent computerized transcription errors may still be present.

## 2022-07-19 NOTE — ED Triage Notes (Signed)
Rt ear pain since this am  no pain med taken

## 2022-08-01 ENCOUNTER — Ambulatory Visit
Admission: EM | Admit: 2022-08-01 | Discharge: 2022-08-01 | Disposition: A | Discharge status: Home / Self Care | Payer: Medicaid Other | Attending: Internal Medicine | Admitting: Internal Medicine

## 2022-08-01 DIAGNOSIS — R52 Pain, unspecified: Secondary | ICD-10-CM | POA: Diagnosis not present

## 2022-08-01 DIAGNOSIS — F17210 Nicotine dependence, cigarettes, uncomplicated: Secondary | ICD-10-CM | POA: Insufficient documentation

## 2022-08-01 DIAGNOSIS — R509 Fever, unspecified: Secondary | ICD-10-CM | POA: Insufficient documentation

## 2022-08-01 DIAGNOSIS — Z1152 Encounter for screening for COVID-19: Secondary | ICD-10-CM | POA: Diagnosis not present

## 2022-08-01 MED ORDER — KETOROLAC TROMETHAMINE 30 MG/ML IJ SOLN
30.0000 mg | Freq: Once | INTRAMUSCULAR | Status: AC
  Administered 2022-08-01: 30 mg via INTRAMUSCULAR

## 2022-08-01 NOTE — ED Triage Notes (Addendum)
Patient c/o back pain, chills, and body aches since last night. States he took some ibuprofen and it did not help. Patient recently seen at [facility] ED for right ear pain and states they cleaned his ears out.

## 2022-08-01 NOTE — ED Provider Notes (Signed)
[facility]    CSN: [id number] Arrival date & time: 08/01/22  1323      History   Chief Complaint Chief Complaint  Patient presents with   Chills   Back Pain    HPI Casey Gallagher. is a 32 y.o. male presents to urgent care today with complaint of fever, chills and body aches.  He reports this started yesterday.  He denies headache, runny nose, nasal congestion, ear pain, sore throat, cough, shortness of breath, nausea, vomiting or diarrhea.  He denies urinary urgency, frequency, dysuria, penile lesion, penile discharge, testicular pain or swelling.  He has taken ibuprofen OTC with minimal relief of symptoms.  He has not had sick contacts that he is aware of.  HPI  Past Medical History:  Diagnosis Date   Asthma    Emphysema     Patient Active Problem List   Diagnosis Date Noted   Syncope and collapse 12/22/2020   Asthma 12/22/2020   Tobacco use 12/22/2020   Marijuana use 12/22/2020    Past Surgical History:  Procedure Laterality Date   TONSILLECTOMY         Home Medications    Prior to Admission medications   Medication Sig Start Date End Date Taking? Authorizing Provider  albuterol (VENTOLIN HFA) 108 (90 Base) MCG/ACT inhaler Inhale 2 puffs into the lungs every 4 (four) hours as needed for wheezing or shortness of breath. 06/21/22   Zenia Resides, MD    Family History Family History  Problem Relation Age of Onset   Bipolar disorder Mother    Heart failure Mother    Schizophrenia Mother    Heart failure Father    Schizophrenia Father    Bipolar disorder Father     Social History Social History   Tobacco Use   Smoking status: Every Day    Current packs/day: 0.50    Average packs/day: 0.5 packs/day for 13.0 years (6.5 ttl pk-yrs)    Types: Cigarettes   Smokeless tobacco: Never  Vaping Use   Vaping status: Never Used  Substance Use Topics   Alcohol use: Yes    Comment: very rare   Drug use: Yes    Types: Marijuana      Allergies   Patient has no known allergies.   Review of Systems Review of Systems  Constitutional:  Positive for chills, fatigue and fever.  HENT:  Negative for congestion, ear pain, rhinorrhea, sinus pressure, sinus pain and sore throat.   Eyes:  Negative for pain and redness.  Respiratory:  Negative for cough and shortness of breath.   Cardiovascular:  Negative for chest pain.  Gastrointestinal:  Negative for diarrhea, nausea and vomiting.  Genitourinary:  Negative for dysuria, frequency, genital sores, hematuria, penile discharge, penile pain, penile swelling, scrotal swelling, testicular pain and urgency.  Musculoskeletal:  Positive for arthralgias and myalgias.  Skin:  Negative for rash.  Neurological:  Negative for dizziness, weakness and headaches.     Physical Exam Triage Vital Signs ED Triage Vitals [08/01/22 1332]  Encounter Vitals Group     BP      Systolic BP Percentile      Diastolic BP Percentile      Pulse Rate 88     Resp 16     Temp 99.4 F (37.4 C)     Temp Source Oral     SpO2 98 %     Weight      Height      Head Circumference  Peak Flow      Pain Score 6     Pain Loc      Pain Education      Exclude from Growth Chart    No data found.  Updated Vital Signs Pulse 88   Temp 99.4 F (37.4 C) (Oral)   Resp 16   SpO2 98%     Physical Exam Constitutional:      Appearance: He is ill-appearing.  HENT:     Head: Normocephalic.     Comments: No sinus tenderness noted    Right Ear: External ear normal. There is impacted cerumen.     Left Ear: External ear normal. There is impacted cerumen.     Nose: Nose normal.     Mouth/Throat:     Mouth: Mucous membranes are moist.     Pharynx: No posterior oropharyngeal erythema.  Eyes:     Extraocular Movements: Extraocular movements intact.     Conjunctiva/sclera: Conjunctivae normal.     Pupils: Pupils are equal, round, and reactive to light.  Cardiovascular:     Rate and Rhythm: Normal  rate and regular rhythm.     Heart sounds: Normal heart sounds.  Pulmonary:     Effort: Pulmonary effort is normal.     Breath sounds: Normal breath sounds. No wheezing, rhonchi or rales.  Abdominal:     Tenderness: There is no right CVA tenderness or left CVA tenderness.  Lymphadenopathy:     Cervical: No cervical adenopathy.  Skin:    General: Skin is warm and dry.     Findings: No rash.  Neurological:     General: No focal deficit present.     Mental Status: He is alert and oriented to person, place, and time.      UC Treatments / Results  Labs  Labs Reviewed  SARS CORONAVIRUS 2 (TAT 6-24 HRS)    EKG   Radiology No results found.  Procedures Procedures (including critical care time)  Medications Ordered in UC Medications  ketorolac (TORADOL) 30 MG/ML injection 30 mg (has no administration in time range)    Initial Impression / Assessment and Plan / UC Course  I have reviewed the triage vital signs and the nursing notes.  Pertinent labs & imaging results that were available during my care of the patient were reviewed by me and considered in my medical decision making (see chart for details).     32 year old male with 1 day history of fever, chills and body aches.  He denies respiratory, GI or GU symptoms.  He has not noticed a rash.  Will check COVID swab-he is advised he will be called with any positive result and recommended  treatment.  Toradol 30 mg IM x 1 for body aches.  Advised him to alternate Tylenol and ibuprofen OTC.  Encourage rest and fluids.  Follow-up if symptoms persist or worsen.  Final diagnoses:  Body aches   Discharge Instructions   None    ED Prescriptions   None    PDMP not reviewed this encounter.   Lorre Munroe, NP 08/01/22 1353

## 2022-08-01 NOTE — Discharge Instructions (Addendum)
You were seen today for fever chills and bodyaches.  This could be a viral illness.  We tested you for COVID and you will be called with any positive result with recommended treatment plan.  You received a Toradol injection for your body aches.  We recommend Tylenol and ibuprofen OTC as needed for fever and bodyaches.  We recommend rest and fluids.  Please follow-up with your PCP if symptoms persist or worsen.

## 2022-08-02 LAB — SARS CORONAVIRUS 2 (TAT 6-24 HRS): SARS Coronavirus 2: NEGATIVE

## 2022-09-25 ENCOUNTER — Encounter (HOSPITAL_COMMUNITY): Payer: Self-pay

## 2022-09-25 ENCOUNTER — Ambulatory Visit (HOSPITAL_COMMUNITY)
Admission: EM | Admit: 2022-09-25 | Discharge: 2022-09-25 | Disposition: A | Discharge status: Home / Self Care | Payer: Medicaid Other | Attending: Emergency Medicine | Admitting: Emergency Medicine

## 2022-09-25 DIAGNOSIS — M62838 Other muscle spasm: Secondary | ICD-10-CM

## 2022-09-25 MED ORDER — DEXAMETHASONE SODIUM PHOSPHATE 10 MG/ML IJ SOLN
10.0000 mg | Freq: Once | INTRAMUSCULAR | Status: AC
  Administered 2022-09-25: 10 mg via INTRAMUSCULAR

## 2022-09-25 MED ORDER — METHOCARBAMOL 500 MG PO TABS: 500.0000 mg | ORAL_TABLET | Freq: Two times a day (BID) | ORAL | 0 refills | Status: DC

## 2022-09-25 MED ORDER — DEXAMETHASONE SODIUM PHOSPHATE 10 MG/ML IJ SOLN
INTRAMUSCULAR | Status: AC
  Filled 2022-09-25: qty 1

## 2022-09-25 MED ORDER — PREDNISONE 20 MG PO TABS: 40.0000 mg | ORAL_TABLET | Freq: Every day | ORAL | 0 refills | Status: AC

## 2022-09-25 NOTE — ED Triage Notes (Signed)
Pt states neck pain/tension that radiates down left arm states worsening after working all day. States he took ibuprofen and muscle relaxer's with some relief.

## 2022-09-25 NOTE — ED Provider Notes (Signed)
[facility]    CSN: [id number] Arrival date & time: 09/25/22  1802      History   Chief Complaint Chief Complaint  Patient presents with   Neck Pain    HPI Casey Shor. is a 32 y.o. male.   Patient presents to clinic for right-sided neck and trapezius pain that has been present since work on Sunday night. Pain was present on Monday and after working the pain increased.  He has been taking 1000 mg of ibuprofen and a muscle relaxant without much relief.  Has tried warm showers.  Initially thought it was a muscle spasm, but it has not improved.  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   Reports some numbness in his palm.  Denies any weakness.  No injuries.  No recent trauma or falls. No skin changes or rashes.      The history is provided by the patient and medical records.  Neck Pain Associated symptoms: no weakness     Past Medical History:  Diagnosis Date   Asthma    Emphysema     Patient Active Problem List   Diagnosis Date Noted   Syncope and collapse 12/22/2020   Asthma 12/22/2020   Tobacco use 12/22/2020   Marijuana use 12/22/2020    Past Surgical History:  Procedure Laterality Date   TONSILLECTOMY         Home Medications    Prior to Admission medications   Medication Sig Start Date End Date Taking? Authorizing Provider  methocarbamol (ROBAXIN) 500 MG tablet Take 1 tablet (500 mg total) by mouth 2 (two) times daily. 09/25/22  Yes Rinaldo Ratel, [place] N, FNP  predniSONE (DELTASONE) 20 MG tablet Take 2 tablets (40 mg total) by mouth daily for 5 days. 09/25/22 09/30/22 Yes Rinaldo Ratel, [place] N, FNP  albuterol (VENTOLIN HFA) 108 (90 Base) MCG/ACT inhaler Inhale 2 puffs into the lungs every 4 (four) hours as needed for wheezing or shortness of breath. 06/21/22   Zenia Resides, MD    Family History Family History  Problem Relation Age of Onset   Bipolar disorder Mother    Heart failure Mother    Schizophrenia Mother    Heart failure  Father    Schizophrenia Father    Bipolar disorder Father     Social History Social History   Tobacco Use   Smoking status: Every Day    Current packs/day: 0.50    Average packs/day: 0.5 packs/day for 13.0 years (6.5 ttl pk-yrs)    Types: Cigarettes   Smokeless tobacco: Never  Vaping Use   Vaping status: Never Used  Substance Use Topics   Alcohol use: Yes    Comment: very rare   Drug use: Yes    Types: Marijuana     Allergies   Patient has no known allergies.   Review of Systems Review of Systems  Musculoskeletal:  Positive for neck pain and neck stiffness.  Neurological:  Negative for weakness.     Physical Exam Triage Vital Signs ED Triage Vitals  Encounter Vitals Group     BP 09/25/22 1842 121/79     Systolic BP Percentile --      Diastolic BP Percentile --      Pulse Rate 09/25/22 1842 (!) 53     Resp 09/25/22 1842 16     Temp 09/25/22 1842 97.9 F (36.6 C)     Temp Source 09/25/22 1842 Oral     SpO2 09/25/22 1842 99 %  Weight --      Height --      Head Circumference --      Peak Flow --      Pain Score 09/25/22 1844 10     Pain Loc --      Pain Education --      Exclude from Growth Chart --    No data found.  Updated Vital Signs BP 121/79 (BP Location: Left Arm)   Pulse (!) 53   Temp 97.9 F (36.6 C) (Oral)   Resp 16   SpO2 99%   Visual Acuity Right Eye Distance:   Left Eye Distance:   Bilateral Distance:    Right Eye Near:   Left Eye Near:    Bilateral Near:     Physical Exam Vitals and nursing note reviewed.  Constitutional:      Appearance: Normal appearance.  HENT:     Head: Normocephalic and atraumatic.     Right Ear: External ear normal.     Left Ear: External ear normal.     Nose: Nose normal.     Mouth/Throat:     Mouth: Mucous membranes are moist.  Eyes:     Conjunctiva/sclera: Conjunctivae normal.  Cardiovascular:     Rate and Rhythm: Normal rate and regular rhythm.     Heart sounds: Normal heart sounds. No  murmur heard. Pulmonary:     Effort: Pulmonary effort is normal. No respiratory distress.     Breath sounds: Normal breath sounds.  Musculoskeletal:        General: Tenderness present. No swelling, deformity or signs of injury. Normal range of motion.     Cervical back: Normal range of motion. Pain with movement and muscular tenderness present.  Skin:    General: Skin is warm and dry.  Neurological:     General: No focal deficit present.     Mental Status: He is alert and oriented to person, place, and time.  Psychiatric:        Mood and Affect: Mood normal.        Behavior: Behavior normal. Behavior is cooperative.      UC Treatments / Results  Labs (all labs ordered are listed, but only abnormal results are displayed) Labs Reviewed - No data to display  EKG   Radiology No results found.  Procedures Procedures (including critical care time)  Medications Ordered in UC Medications  dexamethasone (DECADRON) injection 10 mg (10 mg Intramuscular Given 09/25/22 1912)    Initial Impression / Assessment and Plan / UC Course  I have reviewed the triage vital signs and the nursing notes.  Pertinent labs & imaging results that were available during my care of the patient were reviewed by me and considered in my medical decision making (see chart for details).  Vitals and triage reviewed, patient is hemodynamically stable.  Left-sided trapezius musculoskeletal tenderness to palpation and movement pain.  Cervical spine without step-off or deformity, atraumatic.  Deferred imaging at this time.  Strength 5 out of 5 in upper extremities, radial pulses 2+ with brisk capillary refill. Suspect trapezius MS spasm, will trial IM steroids and oral steroids with muscle relaxers. POC, f/u care and return precautions given, no questions at this time. Work note provided.      Final Clinical Impressions(s) / UC Diagnoses   Final diagnoses:  Trapezius muscle spasm     Discharge Instructions       Your exam and physical were consistent with a muscle spasm.  We have  given you a one-time steroid injection in clinic.  Starting tomorrow take the steroids with breakfast.  You can take the muscle relaxer Robaxin up to 2 times daily, do not take this in combination with any other muscle relaxers.  This may cause drowsiness, do not drink or drive on this medication.  Please continue to rest, heat the area, and perform gentle stretches.  For anti-inflammatory effect you can take up to 800 mg of ibuprofen every 8 hours.  Do not take more than 800 mg at a time.   Return to clinic for any new or urgent symptoms.  Follow-up with your primary care if no improvement despite these interventions.      ED Prescriptions     Medication Sig Dispense Auth. Provider   methocarbamol (ROBAXIN) 500 MG tablet Take 1 tablet (500 mg total) by mouth 2 (two) times daily. 20 tablet Rinaldo Ratel, [place] N, [place]   predniSONE (DELTASONE) 20 MG tablet Take 2 tablets (40 mg total) by mouth daily for 5 days. 10 tablet Ahron Hulbert, [place] N, FNP      PDMP not reviewed this encounter.   Brantlee Penn, [place] N, [place] 09/25/22 [phone]

## 2022-09-25 NOTE — Discharge Instructions (Addendum)
Your exam and physical were consistent with a muscle spasm.  We have given you a one-time steroid injection in clinic.  Starting tomorrow take the steroids with breakfast.  You can take the muscle relaxer Robaxin up to 2 times daily, do not take this in combination with any other muscle relaxers.  This may cause drowsiness, do not drink or drive on this medication.  Please continue to rest, heat the area, and perform gentle stretches.  For anti-inflammatory effect you can take up to 800 mg of ibuprofen every 8 hours.  Do not take more than 800 mg at a time.   Return to clinic for any new or urgent symptoms.  Follow-up with your primary care if no improvement despite these interventions.

## 2022-10-07 ENCOUNTER — Ambulatory Visit
Admission: EM | Admit: 2022-10-07 | Discharge: 2022-10-07 | Disposition: A | Discharge status: Home / Self Care | Payer: Medicaid Other | Attending: Internal Medicine | Admitting: Internal Medicine

## 2022-10-07 ENCOUNTER — Encounter: Payer: Self-pay | Admitting: *Deleted

## 2022-10-07 ENCOUNTER — Other Ambulatory Visit: Payer: Self-pay

## 2022-10-07 ENCOUNTER — Ambulatory Visit: Payer: Medicaid Other

## 2022-10-07 DIAGNOSIS — M25512 Pain in left shoulder: Secondary | ICD-10-CM

## 2022-10-07 DIAGNOSIS — M542 Cervicalgia: Secondary | ICD-10-CM

## 2022-10-07 NOTE — Discharge Instructions (Signed)
Please follow-up with orthopedist tomorrow to schedule an appointment for further evaluation.  Continue muscle relaxer.  X-rays are pending.  I will call with the results.

## 2022-10-07 NOTE — ED Triage Notes (Signed)
2 weeks of left shoulder pain radiating into arm. Denies injury. He went to Urgent Care on Friday- had toradol shot and was Rx'ed prednisone and muscle relaxers. Pt states he had initial relief but once the prednisone was finished the pain came back. Also has numbness in left hand and elbow. States "feels like a vice grip"

## 2022-10-07 NOTE — ED Provider Notes (Signed)
[facility]    CSN: [id number] Arrival date & time: 10/07/22  1520      History   Chief Complaint Chief Complaint  Patient presents with   Shoulder Pain    HPI Casey Goff. is a 32 y.o. male.   Patient presents with persistent neck and left shoulder/left upper extremity pain.  Patient was seen at a different urgent care on 09/25/2022.  He was thought to have muscle strain so was prescribed prednisone and a muscle relaxer.  He was also given a Decadron shot that day.  He reports that he has taken the prednisone with some improvement.  Although, he reports that symptoms returned after prednisone was completed.  He has not taken the muscle relaxer.  He has been taking ibuprofen with last dose being a few hours prior to arrival to urgent care.  Denies any injury to the area.  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   Shoulder Pain   Past Medical History:  Diagnosis Date   Asthma    Emphysema     Patient Active Problem List   Diagnosis Date Noted   Syncope and collapse 12/22/2020   Asthma 12/22/2020   Tobacco use 12/22/2020   Marijuana use 12/22/2020    Past Surgical History:  Procedure Laterality Date   TONSILLECTOMY         Home Medications    Prior to Admission medications   Medication Sig Start Date End Date Taking? Authorizing Provider  albuterol (VENTOLIN HFA) 108 (90 Base) MCG/ACT inhaler Inhale 2 puffs into the lungs every 4 (four) hours as needed for wheezing or shortness of breath. 06/21/22   Zenia Resides, MD  methocarbamol (ROBAXIN) 500 MG tablet Take 1 tablet (500 mg total) by mouth 2 (two) times daily. 09/25/22   Garrison, [place] N, FNP    Family History Family History  Problem Relation Age of Onset   Bipolar disorder Mother    Heart failure Mother    Schizophrenia Mother    Heart failure Father    Schizophrenia Father    Bipolar disorder Father     Social History Social History   Tobacco Use   Smoking status:  Every Day    Current packs/day: 0.50    Average packs/day: 0.5 packs/day for 13.0 years (6.5 ttl pk-yrs)    Types: Cigarettes   Smokeless tobacco: Never  Vaping Use   Vaping status: Never Used  Substance Use Topics   Alcohol use: Yes    Comment: very rare   Drug use: Yes    Types: Marijuana     Allergies   Patient has no known allergies.   Review of Systems Review of Systems Per HPI  Physical Exam Triage Vital Signs ED Triage Vitals  Encounter Vitals Group     BP 10/07/22 1717 131/86     Systolic BP Percentile --      Diastolic BP Percentile --      Pulse Rate 10/07/22 1717 63     Resp 10/07/22 1717 18     Temp 10/07/22 1717 98 F (36.7 C)     Temp Source 10/07/22 1717 Oral     SpO2 10/07/22 1717 100 %     Weight --      Height --      Head Circumference --      Peak Flow --      Pain Score 10/07/22 1715 10     Pain Loc --  Pain Education --      Exclude from Growth Chart --    No data found.  Updated Vital Signs BP 131/86 (BP Location: Right Arm)   Pulse 63   Temp 98 F (36.7 C) (Oral)   Resp 18   SpO2 100%   Visual Acuity Right Eye Distance:   Left Eye Distance:   Bilateral Distance:    Right Eye Near:   Left Eye Near:    Bilateral Near:     Physical Exam Constitutional:      General: He is not in acute distress.    Appearance: Normal appearance. He is not toxic-appearing or diaphoretic.  HENT:     Head: Normocephalic and atraumatic.  Eyes:     Extraocular Movements: Extraocular movements intact.     Conjunctiva/sclera: Conjunctivae normal.  Neck:     Comments: Patient has tenderness to palpation throughout mid cervical area that extends into the lateral neck and upper trapezius muscle.  Mild tenderness to palpation to left lateral shoulder as well.  Patient has full range of motion of upper extremity with grip strength being 5/5.  Appears neurovascularly intact.  No crepitus or step-off noted.  No discoloration or swelling  noted. Pulmonary:     Effort: Pulmonary effort is normal.  Neurological:     General: No focal deficit present.     Mental Status: He is alert and oriented to person, place, and time. Mental status is at baseline.  Psychiatric:        Mood and Affect: Mood normal.        Behavior: Behavior normal.        Thought Content: Thought content normal.        Judgment: Judgment normal.      UC Treatments / Results  Labs (all labs ordered are listed, but only abnormal results are displayed) Labs Reviewed - No data to display  EKG   Radiology DG Cervical Spine 2-3 Views  Result Date: 10/07/2022 CLINICAL DATA:  Neck pain. EXAM: CERVICAL SPINE - 2-3 VIEW COMPARISON:  None Available. FINDINGS: The alignment is normal. Disc spaces are normal. Normal vertebral body heights. No evidence of fracture or focal bone abnormality. No prevertebral soft tissue thickening. IMPRESSION: Negative radiographs of the cervical spine. Electronically Signed   By: Narda Rutherford M.D.   On: 10/07/2022 18:11   DG Shoulder Left  Result Date: 10/07/2022 CLINICAL DATA:  Left shoulder pain. EXAM: LEFT SHOULDER - 2+ VIEW COMPARISON:  None Available. FINDINGS: There is no evidence of fracture or dislocation. Normal alignment and joint spaces. There is no evidence of arthropathy or other focal bone abnormality. Soft tissues are unremarkable. IMPRESSION: Negative radiographs of the left shoulder. Electronically Signed   By: Narda Rutherford M.D.   On: 10/07/2022 18:10    Procedures Procedures (including critical care time)  Medications Ordered in UC Medications - No data to display  Initial Impression / Assessment and Plan / UC Course  I have reviewed the triage vital signs and the nursing notes.  Pertinent labs & imaging results that were available during my care of the patient were reviewed by me and considered in my medical decision making (see chart for details).     X-rays completed given persistent pain  which are negative for any acute bony abnormality.  Suspect muscle strain/injury.  Patient encouraged to take muscle relaxer as I do think this will be helpful.  Will defer any additional steroids at this time.  I do think patient  needs to see orthopedic and sports medicine given symptoms have been persistent so I provided patient with contact information for follow-up as soon as possible.  In the meantime, advised muscle relaxer and supportive care.  Patient verbalized understanding and was agreeable with plan.  Called patient and notified him of x-ray results. Final Clinical Impressions(s) / UC Diagnoses   Final diagnoses:  Neck pain  Acute pain of left shoulder     Discharge Instructions      Please follow-up with orthopedist tomorrow to schedule an appointment for further evaluation.  Continue muscle relaxer.  X-rays are pending.  I will call with the results.    ED Prescriptions   None    PDMP not reviewed this encounter.   Gustavus Bryant, [place] 10/07/22 [phone]

## 2022-10-08 ENCOUNTER — Ambulatory Visit: Payer: Medicaid Other | Admitting: Physician Assistant

## 2022-10-08 ENCOUNTER — Telehealth: Payer: Self-pay | Admitting: Physician Assistant

## 2022-10-08 ENCOUNTER — Encounter: Payer: Self-pay | Admitting: Physician Assistant

## 2022-10-08 DIAGNOSIS — M5412 Radiculopathy, cervical region: Secondary | ICD-10-CM

## 2022-10-08 DIAGNOSIS — M5020 Other cervical disc displacement, unspecified cervical region: Secondary | ICD-10-CM

## 2022-10-08 DIAGNOSIS — M25512 Pain in left shoulder: Secondary | ICD-10-CM

## 2022-10-08 DIAGNOSIS — M542 Cervicalgia: Secondary | ICD-10-CM | POA: Insufficient documentation

## 2022-10-08 MED ORDER — HYDROCODONE-ACETAMINOPHEN 5-325 MG PO TABS: 1.0000 | ORAL_TABLET | ORAL | 0 refills | Status: DC | PRN

## 2022-10-08 MED ORDER — MELOXICAM 15 MG PO TABS: 15.0000 mg | ORAL_TABLET | Freq: Every day | ORAL | 0 refills | Status: DC

## 2022-10-08 NOTE — Progress Notes (Signed)
Office Visit Note   Patient: Davio Riskin.           Date of Birth: 11-26-1990           MRN: [id number] Visit Date: 10/08/2022              Requested by: [name], MD [address] [place],  [place] [address] PCP: [name], MD   Assessment & Plan: Visit Diagnoses:  1. Acute pain of left shoulder   2. Neck pain   3. Cervical radiculopathy   4. 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  He has had an acute 2-week onset of severe cervical neck pain with radiation into his trapezius.  He was seen twice in the emergency room for this.  He has been given Decadron, Toradol injection.  He did have a Depo-Medrol Dosepak which she finished on Monday.  He had minimal relief with it and now all of the pain has returned.  Denies any fever or chills.  He says his neck is getting stiffer and with any movement with extension or flexion he gets reproduction of paresthesias that go into his right shoulder and then go from his forearm into his hand.  He is also losing strength.  This is progressing enough that I feel at this time I would like for him to get a stat MRI I am concerned for herniated disc and then have him follow-up with Dr. Christell Constant.  Will go to the ER if symptoms increase prior to the MRI in the meantime we will give him a Toradol injection today.  Also given a few hydrocodone which she is to take sparingly and not to be out and about when he is taking them.  Will also switch him to 15 mg of meloxicam knows not to take these with other anti-inflammatories.  X-rays of his cervical spine and shoulder were attempted yesterday.  Cervical spine demonstrating straightening of the normal lordotic curve shoulder x-rays do not show any pathology  Follow-Up Instructions: No follow-ups on file.   Orders:  Orders Placed This Encounter  Procedures   MR Cervical Spine w/o contrast   Ambulatory referral to  Orthopedic Surgery   Meds ordered this encounter  Medications   HYDROcodone-acetaminophen (NORCO/VICODIN) 5-325 MG tablet    Sig: Take 1 tablet by mouth every 4 (four) hours as needed for moderate pain.    Dispense:  20 tablet    Refill:  0   meloxicam (MOBIC) 15 MG tablet    Sig: Take 1 tablet (15 mg total) by mouth daily.    Dispense:  30 tablet    Refill:  0      Procedures: No procedures performed   Clinical Data: No additional findings.   Subjective: Chief Complaint  Patient presents with   Neck - Pain    HPI pleasant 32 year old gentleman with a 2-week history of severe neck and radicular pain going down his left arm with associated paresthesias and progressive weakness.  Denies any particular injury.  Says he has had some spasms in the neck in the past but nothing like this.    Review of Systems  All other systems reviewed and are negative.    Objective: Vital Signs: There were no vitals taken for this visit.  Physical Exam Constitutional:      Appearance: Normal appearance.  Pulmonary:     Effort: Pulmonary effort  is normal.  Skin:    General: Skin is warm and dry.  Neurological:     General: No focal deficit present.     Mental Status: He is alert.  Psychiatric:        Mood and Affect: Mood normal.        Behavior: Behavior normal.     Ortho Exam Evaluation he is in obvious discomfort.  Normal respiration.  He has good overhead reach of his arm and says this actually feels better.  He has significant stiffness with flexion and extension of his neck and turning side-to-side.  This also reproduces paresthesias going down his left arm to his hand.  Grip strength is slightly decreased.  No facial asymmetry.  Also has decreased resisted abduction external rotation and triceps and biceps. Specialty Comments:  No specialty comments available.  Imaging: DG Cervical Spine 2-3 Views  Result Date: 10/07/2022 CLINICAL DATA:  Neck pain. EXAM: CERVICAL SPINE  - 2-3 VIEW COMPARISON:  None Available. FINDINGS: The alignment is normal. Disc spaces are normal. Normal vertebral body heights. No evidence of fracture or focal bone abnormality. No prevertebral soft tissue thickening. IMPRESSION: Negative radiographs of the cervical spine. Electronically Signed   By: Narda Rutherford M.D.   On: 10/07/2022 18:11   DG Shoulder Left  Result Date: 10/07/2022 CLINICAL DATA:  Left shoulder pain. EXAM: LEFT SHOULDER - 2+ VIEW COMPARISON:  None Available. FINDINGS: There is no evidence of fracture or dislocation. Normal alignment and joint spaces. There is no evidence of arthropathy or other focal bone abnormality. Soft tissues are unremarkable. IMPRESSION: Negative radiographs of the left shoulder. Electronically Signed   By: Narda Rutherford M.D.   On: 10/07/2022 18:10     PMFS History: Patient Active Problem List   Diagnosis Date Noted   Pain in left shoulder 10/08/2022   Neck pain 10/08/2022   Cervical radiculopathy 10/08/2022   Herniated disc, cervical 10/08/2022   Syncope and collapse 12/22/2020   Asthma 12/22/2020   Tobacco use 12/22/2020   Marijuana use 12/22/2020   Past Medical History:  Diagnosis Date   Asthma    Emphysema     Family History  Problem Relation Age of Onset   Bipolar disorder Mother    Heart failure Mother    Schizophrenia Mother    Heart failure Father    Schizophrenia Father    Bipolar disorder Father     Past Surgical History:  Procedure Laterality Date   TONSILLECTOMY     Social History   Occupational History   Not on file  Tobacco Use   Smoking status: Every Day    Current packs/day: 0.50    Average packs/day: 0.5 packs/day for 13.0 years (6.5 ttl pk-yrs)    Types: Cigarettes   Smokeless tobacco: Never  Vaping Use   Vaping status: Never Used  Substance and Sexual Activity   Alcohol use: Yes    Comment: very rare   Drug use: Yes    Types: Marijuana   Sexual activity: Not Currently

## 2022-10-08 NOTE — Telephone Encounter (Signed)
Patient called and said that the medication that was sent to pharmacy isn't there. They don't have it. Can you send it to the pharmacy [facility] on [address] CB#[phone]

## 2022-10-09 ENCOUNTER — Telehealth: Payer: Self-pay | Admitting: Surgical

## 2022-10-09 NOTE — Telephone Encounter (Signed)
Pt called requesting his meds methocarbamol and hydrocodone be sent to [facility]. [facility] is out of stock of both meds. Please call pt when sent to [facility]. See last note. Pt phone number is [phone].

## 2022-10-09 NOTE — Telephone Encounter (Signed)
I think you saw this patient.

## 2022-10-12 ENCOUNTER — Other Ambulatory Visit: Payer: Self-pay | Admitting: Physician Assistant

## 2022-10-12 MED ORDER — HYDROCODONE-ACETAMINOPHEN 5-325 MG PO TABS
1.0000 | ORAL_TABLET | ORAL | 0 refills | Status: AC | PRN
Start: 2022-10-12 — End: 2023-10-12

## 2022-10-12 NOTE — Telephone Encounter (Signed)
Casey Gallagher anne patient

## 2022-10-13 ENCOUNTER — Other Ambulatory Visit: Payer: Self-pay | Admitting: Physician Assistant

## 2022-10-13 MED ORDER — METHOCARBAMOL 500 MG PO TABS: 500.0000 mg | ORAL_TABLET | Freq: Two times a day (BID) | ORAL | 0 refills | Status: AC

## 2022-11-05 ENCOUNTER — Ambulatory Visit: Payer: Medicaid Other | Admitting: Orthopedic Surgery

## 2022-11-07 ENCOUNTER — Other Ambulatory Visit: Payer: Self-pay | Admitting: Physician Assistant

## 2023-05-26 IMAGING — MR MR HEAD W/O CM
7 of 10 series · 27 of 48 positions shown · non-contrast
Comparison: None.

CLINICAL DATA: Seizure, new-onset, no history of trauma; mental
status change, unknown cause

EXAM:
MRI HEAD WITHOUT CONTRAST
TECHNIQUE: Multiplanar, multiecho pulse sequences of the brain and surrounding
structures were obtained without intravenous contrast.

[Series 3: DWI · axial · 5.0mm · 1.09mm/px · z∈[-45,+104]mm · 7 of 62 slices shown (1 of 2)]
[im 1/62]
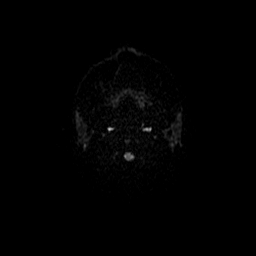
[im 11/62]
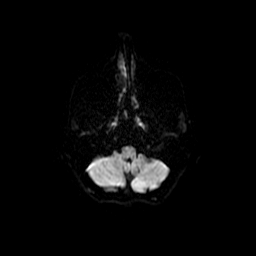
[im 21/62]
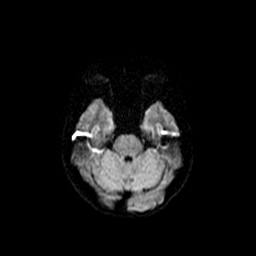
[im 31/62]
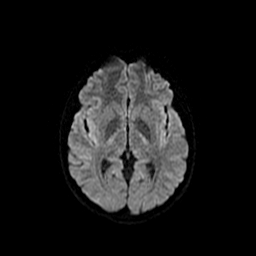
[im 41/62]
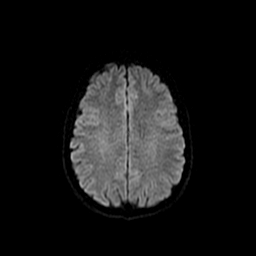
[im 51/62]
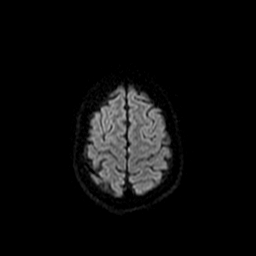
[im 62/62]
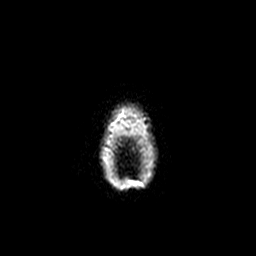

[Series 5: T2 · axial · 5.0mm · 0.45mm/px · z∈[-52,+101]mm · 3 of 23 slices shown (1 of 3)]
[im 1/23]
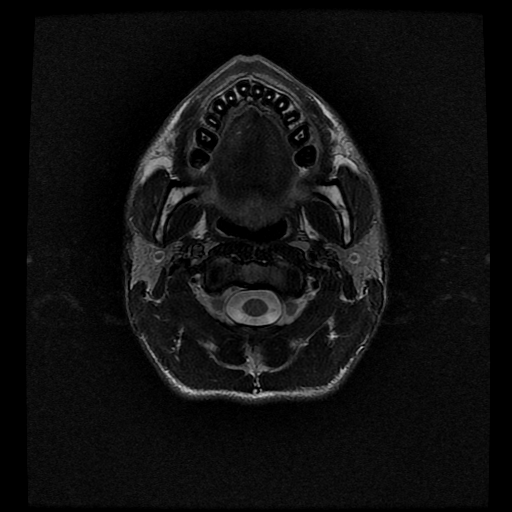
[im 12/23]
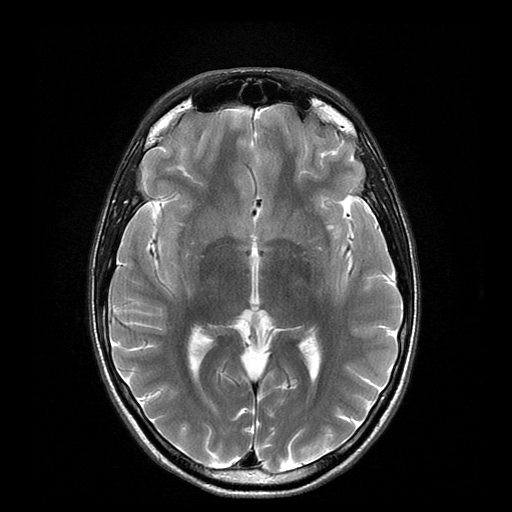
[im 23/23]
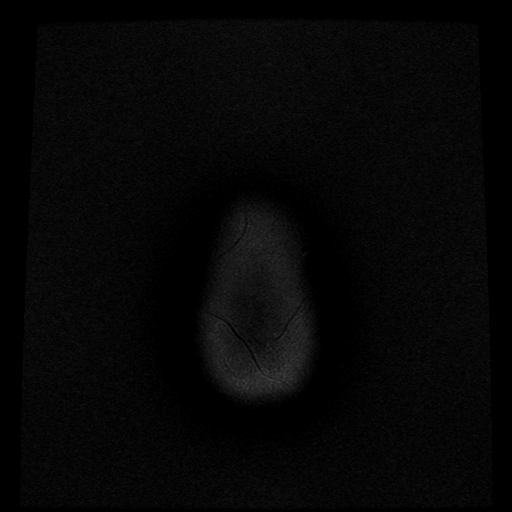

[Series 6: FLAIR · axial · 3.0mm · 0.45mm/px · z∈[-53,+102]mm · 3 of 27 slices shown (1 of 2)]
[im 1/27]
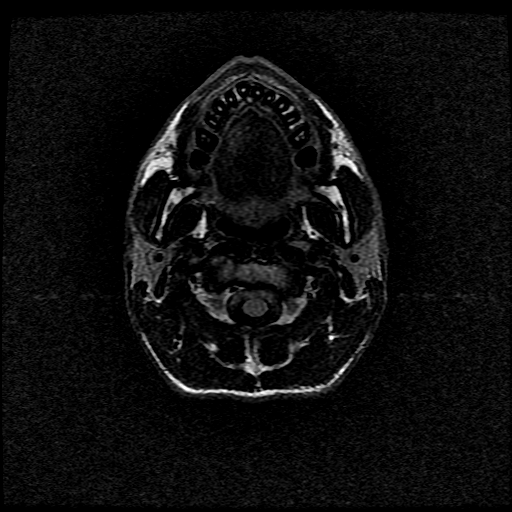
[im 14/27]
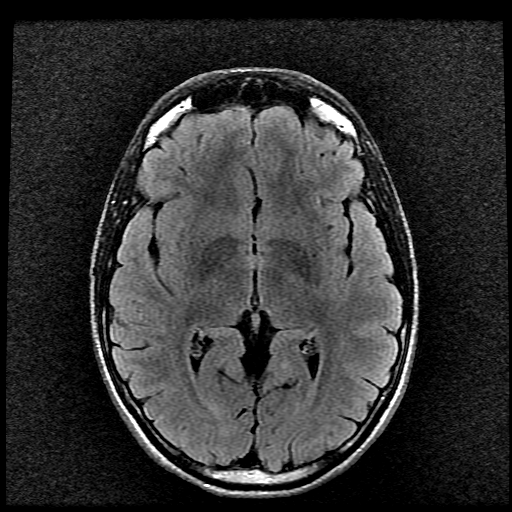
[im 27/27]
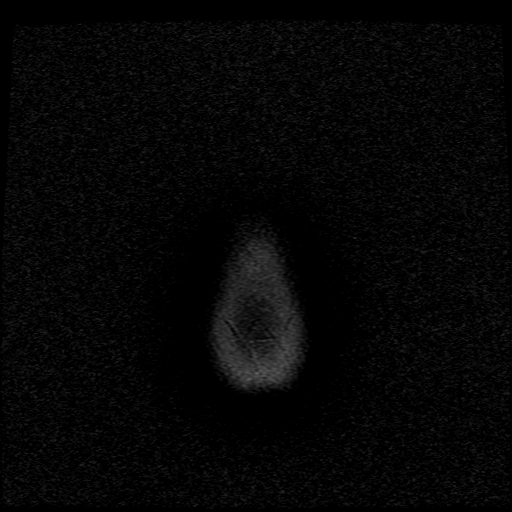

[Series 9: T2 · coronal · 3.0mm · 0.35mm/px · 4 of 34 slices shown (2 of 3)]
[im 1/34]
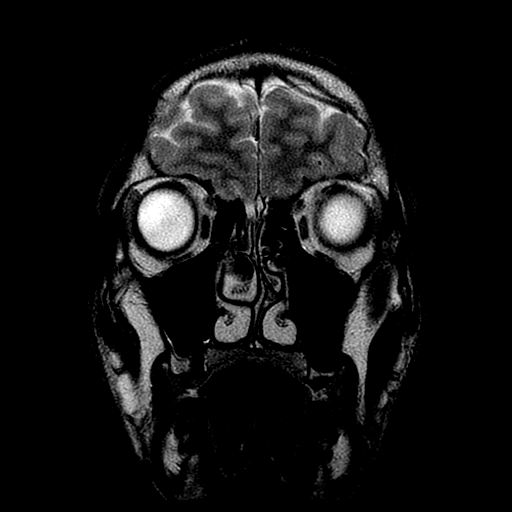
[im 12/34]
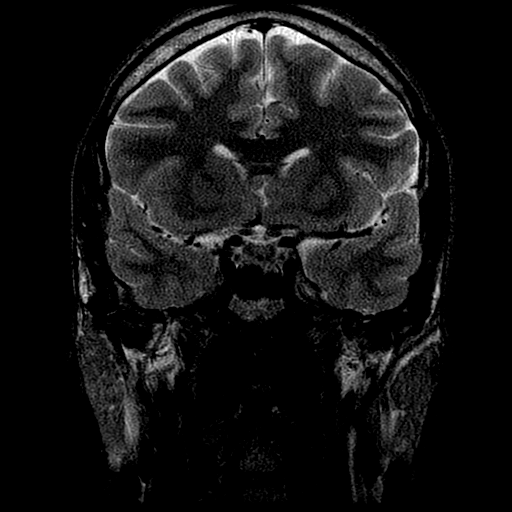
[im 23/34]
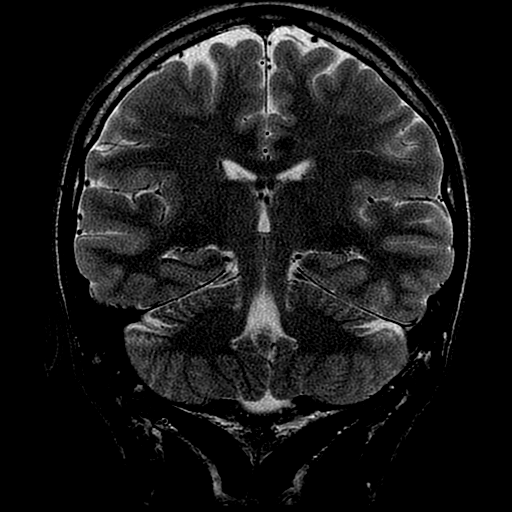
[im 34/34]
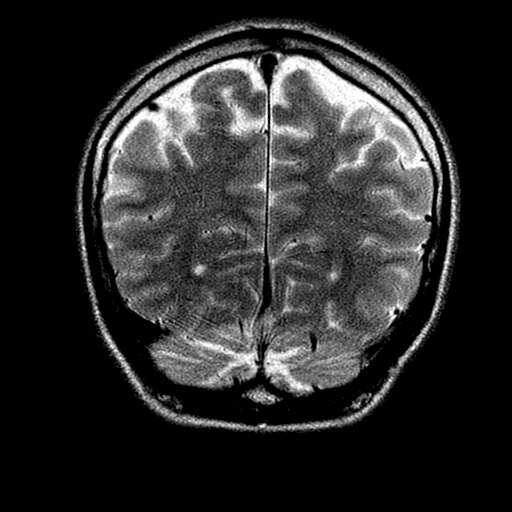

[Series 10: FLAIR · coronal · 3.0mm · 0.39mm/px · 5 of 40 slices shown (2 of 2)]
[im 1/40]
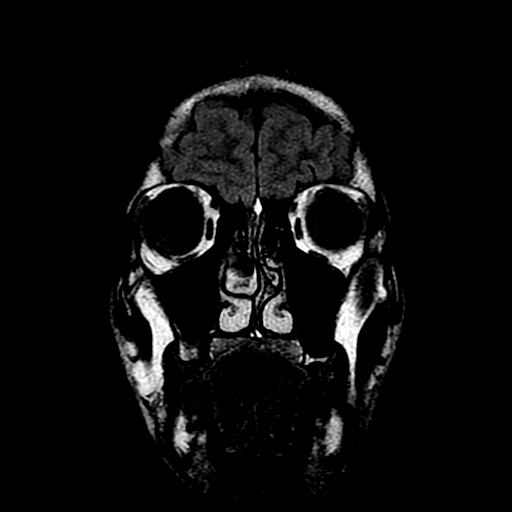
[im 10/40]
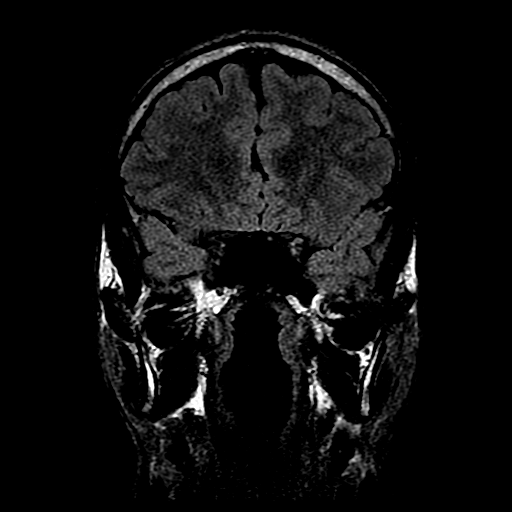
[im 20/40]
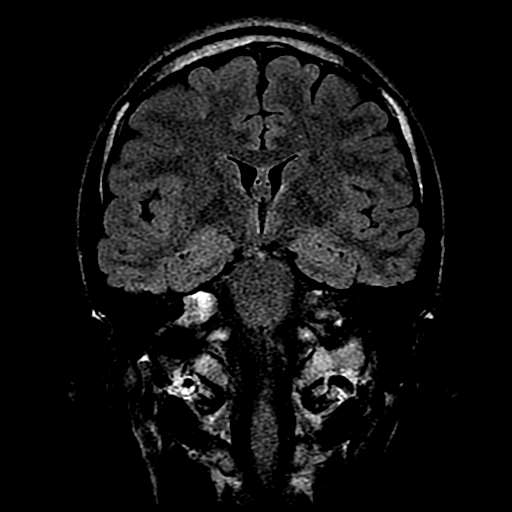
[im 30/40]
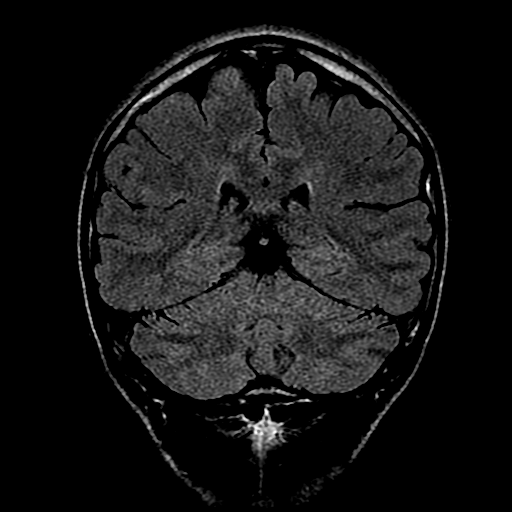
[im 40/40]
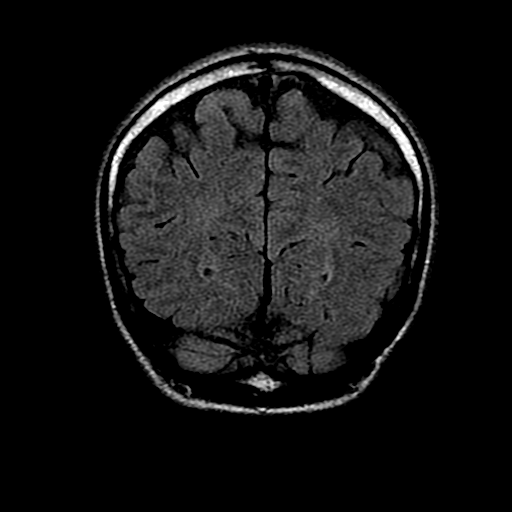

[Series 11: T2 · coronal · 5.0mm · 0.39mm/px · 1 of 29 slices shown (3 of 3)]
[im 1/29]
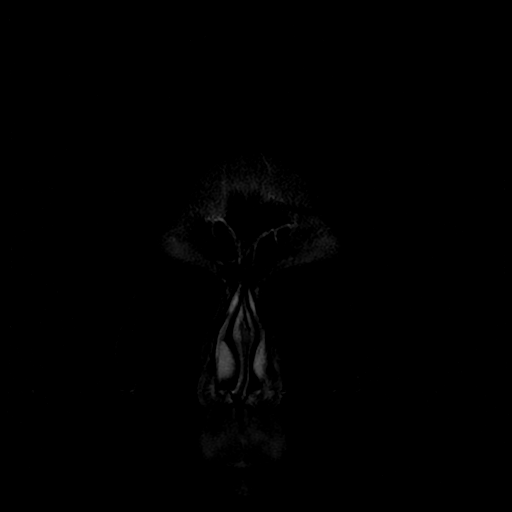

[Series 300: DWI · axial · 5.0mm · 1.09mm/px · z∈[-45,+104]mm · 4 of 31 slices shown (2 of 2)]
[im 1/31]
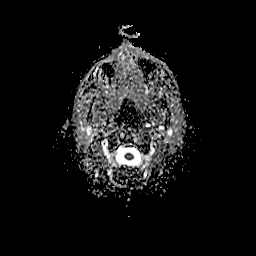
[im 11/31]
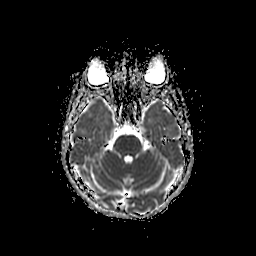
[im 21/31]
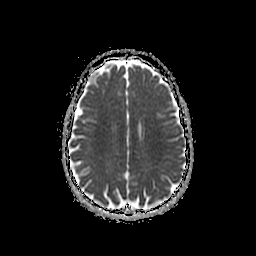
[im 31/31]
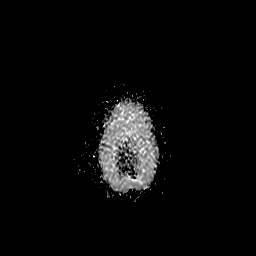

[27 of 48 positions shown; findings below may reference images not displayed]

FINDINGS: Brain: There is no acute infarction or intracranial hemorrhage.
There is no intracranial mass, mass effect, or edema. There is no
hydrocephalus or extra-axial fluid collection. Ventricles and sulci
are normal in size and configuration. Symmetric and unremarkable
hippocampi. Minimal (less than 5) punctate foci of T2 hyperintensity
in the frontal subcortical white matter likely reflect nonspecific
gliosis/demyelination of doubtful significance.

Vascular: Major vessel flow voids at the skull base are preserved.

Skull and upper cervical spine: Normal marrow signal is preserved.

Sinuses/Orbits: Paranasal sinuses are aerated. Orbits are
unremarkable.

Other: Sella is unremarkable. Minimal patchy mastoid fluid
opacification.
IMPRESSION: No significant abnormality.

## 2023-05-26 IMAGING — CR DG CHEST 2V
2 series · 2 of 2 positions shown · non-contrast
Comparison: 12/22/2020

CLINICAL DATA: Chest pain.  Syncope.

EXAM:
CHEST - 2 VIEW

[chest pa]
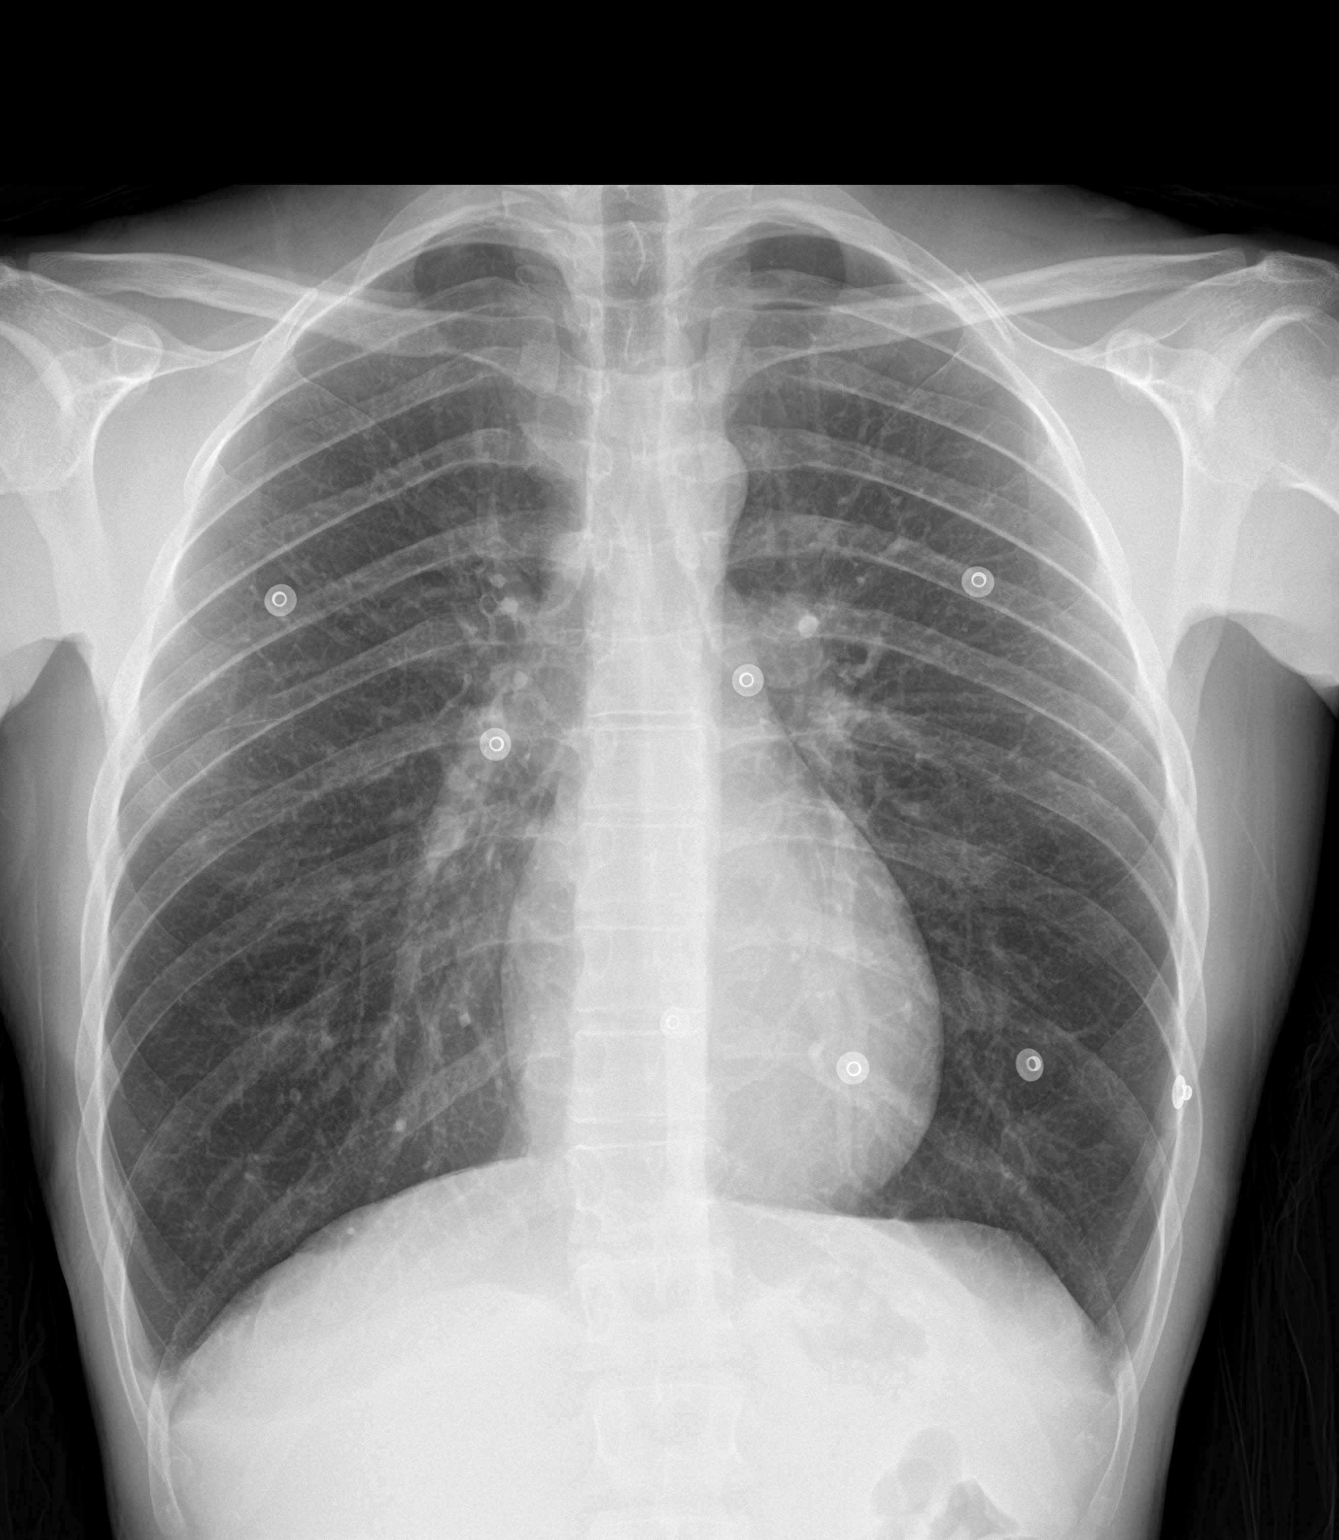

[chest lat]
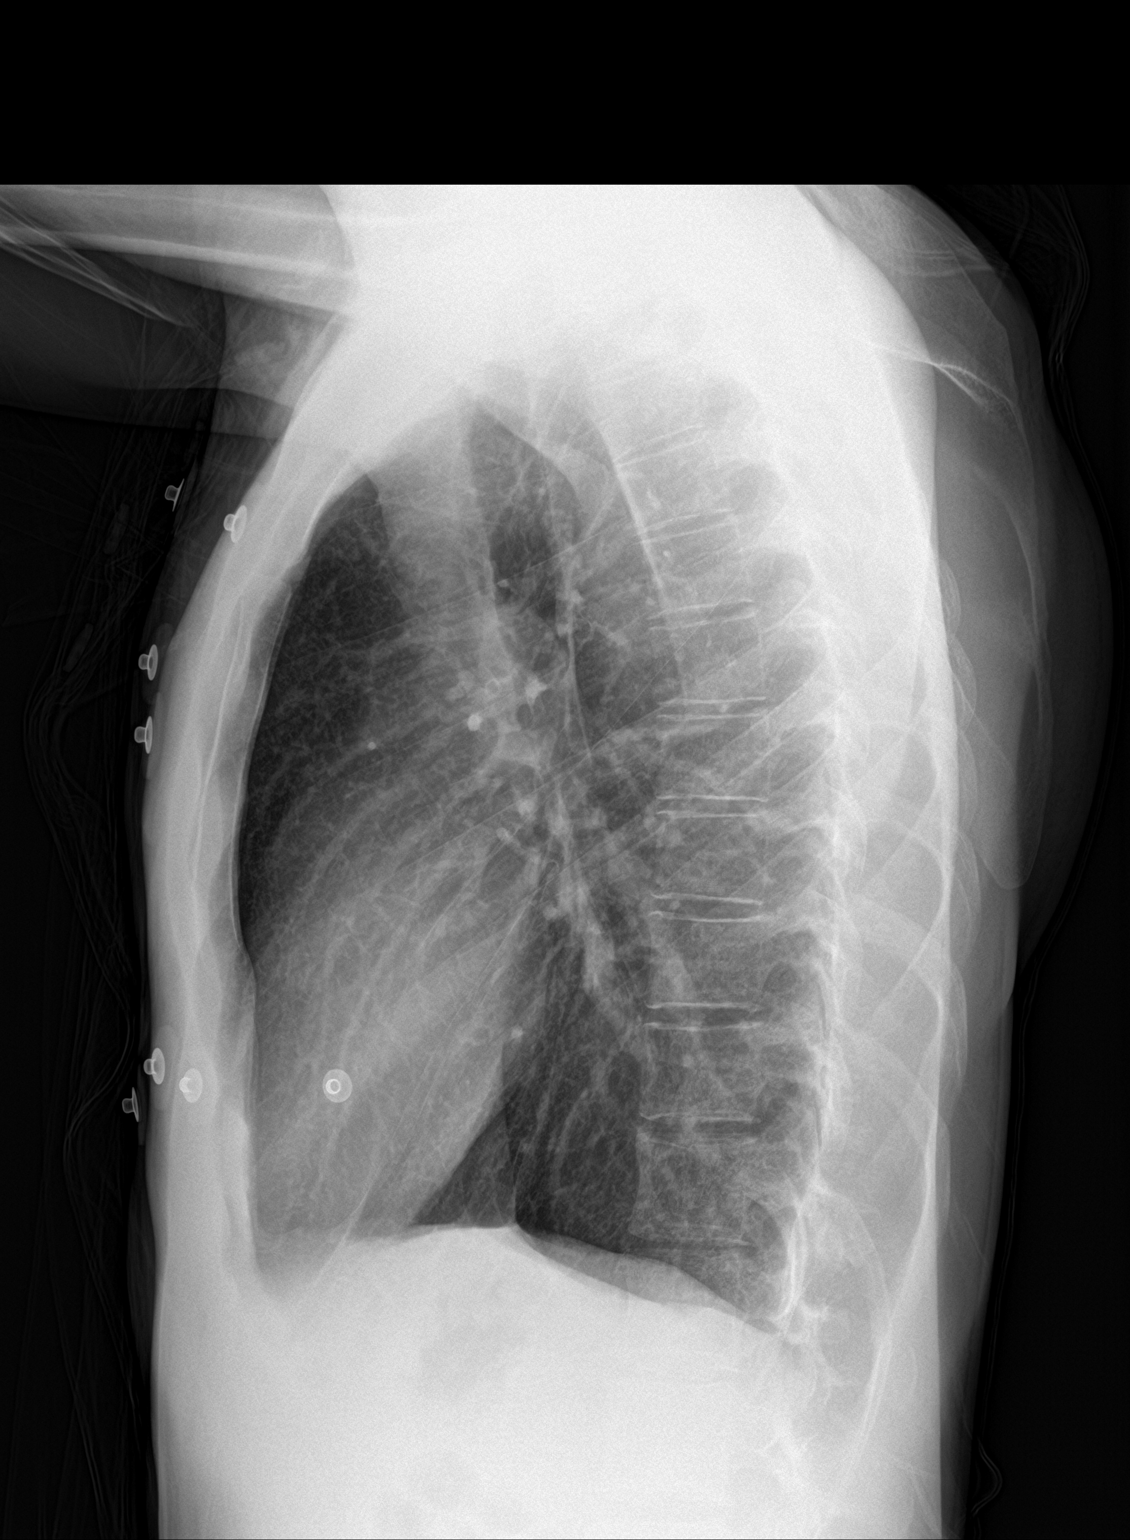

[2 of 2 positions shown; findings below may reference images not displayed]

FINDINGS: Mild pectus excavatum deformity.

Midline trachea.  Normal heart size and mediastinal contours.

Sharp costophrenic angles.  No pneumothorax.  Clear lungs.
IMPRESSION: No active cardiopulmonary disease.

## 2024-01-01 ENCOUNTER — Encounter (HOSPITAL_COMMUNITY): Payer: Self-pay

## 2024-01-01 ENCOUNTER — Ambulatory Visit (HOSPITAL_COMMUNITY)
Admission: EM | Admit: 2024-01-01 | Discharge: 2024-01-01 | Disposition: A | Discharge status: Home / Self Care | Attending: Emergency Medicine | Admitting: Emergency Medicine

## 2024-01-01 DIAGNOSIS — K047 Periapical abscess without sinus: Secondary | ICD-10-CM | POA: Diagnosis not present

## 2024-01-01 LAB — POC COVID19/FLU A&B COMBO
Covid Antigen, POC: NEGATIVE
Influenza A Antigen, POC: NEGATIVE
Influenza B Antigen, POC: NEGATIVE

## 2024-01-01 LAB — POCT RAPID STREP A (OFFICE): Rapid Strep A Screen: NEGATIVE

## 2024-01-01 MED ORDER — AMOXICILLIN-POT CLAVULANATE 875-125 MG PO TABS: 1.0000 | ORAL_TABLET | Freq: Two times a day (BID) | ORAL | 0 refills | Status: AC

## 2024-01-01 MED ORDER — IBUPROFEN 800 MG PO TABS
ORAL_TABLET | ORAL | Status: AC
  Filled 2024-01-01: qty 1

## 2024-01-01 MED ORDER — ACETAMINOPHEN 325 MG PO TABS: 650.0000 mg | ORAL_TABLET | Freq: Once | ORAL | Status: DC

## 2024-01-01 MED ORDER — IBUPROFEN 800 MG PO TABS
800.0000 mg | ORAL_TABLET | Freq: Once | ORAL | Status: AC
  Administered 2024-01-01: 800 mg via ORAL

## 2024-01-01 MED ORDER — ACETAMINOPHEN 325 MG PO TABS
ORAL_TABLET | ORAL | Status: AC
  Filled 2024-01-01: qty 2

## 2024-01-01 NOTE — Discharge Instructions (Addendum)
 Please follow up with dentist on Monday.  Your dental pain is likely due to dental infection. Take  antibiotic as prescribed for the next 7 days to treat your dental infection. Continue use of over the counter medications as needed with food for dental inflammation and pain like tylenol  (up to 3000mg  in 24 hours) and ibuprofen  (up to 1200mg ).  Perform salt water gargles every 3-4 hours.   You may return to work once you do not have a fever.   If you develop any new or worsening symptoms or if your symptoms do not start to improve, pleases return here or follow-up with your primary care provider. If your symptoms are severe, please go to the emergency room.

## 2024-01-01 NOTE — ED Triage Notes (Signed)
 Patient presents with sore throat, cough and chills x 3 days.

## 2024-01-01 NOTE — ED Provider Notes (Signed)
 " [facility]    CSN: [id number] Arrival date & time: 01/01/24  1415      History   Chief Complaint Chief Complaint  Patient presents with   Sore Throat   Cough   Nausea    HPI Casey Gallagher. is a 33 y.o. male.   Started having issues with his teeth about two weeks ago. He has been using orajel daily as well as tylenol  daily. He took tylenol  last around noon yesterday. He has not used ibuprofen . Yesterday started feeling worse with malaise, fever, headache, chills, and sore throat. Today started feeling weak. He does not have significant swelling in his face but he does have tenderness along his jaw. He is waiting for a call back from his dentist. He is febrile. BP and HR WNL.   The history is provided by the patient.  Sore Throat  Cough   Past Medical History:  Diagnosis Date   Asthma    Emphysema     Patient Active Problem List   Diagnosis Date Noted   Pain in left shoulder 10/08/2022   Neck pain 10/08/2022   Cervical radiculopathy 10/08/2022   Herniated disc, cervical 10/08/2022   Syncope and collapse 12/22/2020   Asthma 12/22/2020   Tobacco use 12/22/2020   Marijuana use 12/22/2020    Past Surgical History:  Procedure Laterality Date   TONSILLECTOMY         Home Medications    Prior to Admission medications  Medication Sig Start Date End Date Taking? Authorizing Provider  amoxicillin -clavulanate (AUGMENTIN ) 875-125 MG tablet Take 1 tablet by mouth every 12 (twelve) hours for 7 days. 01/01/24 01/08/24 Yes Remi Pippin, NP  albuterol  (VENTOLIN  HFA) 108 (90 Base) MCG/ACT inhaler Inhale 2 puffs into the lungs every 4 (four) hours as needed for wheezing or shortness of breath. 06/21/22   Vonna Sharlet POUR, MD  meloxicam  (MOBIC ) 15 MG tablet TAKE 1 TABLET (15 MG TOTAL) BY MOUTH DAILY. 11/08/22   Persons, Ronal Dragon, PA  methocarbamol  (ROBAXIN ) 500 MG tablet Take 1 tablet (500 mg total) by mouth 2 (two) times daily. 10/13/22   Persons, Ronal Dragon, PA     Family History Family History  Problem Relation Age of Onset   Bipolar disorder Mother    Heart failure Mother    Schizophrenia Mother    Heart failure Father    Schizophrenia Father    Bipolar disorder Father     Social History Social History[1]   Allergies   Patient has no known allergies.   Review of Systems Review of Systems  Respiratory:  Positive for cough.      Physical Exam Triage Vital Signs ED Triage Vitals  Encounter Vitals Group     BP --      Girls Systolic BP Percentile --      Girls Diastolic BP Percentile --      Boys Systolic BP Percentile --      Boys Diastolic BP Percentile --      Pulse Rate 01/01/24 1440 72     Resp 01/01/24 1440 16     Temp 01/01/24 1440 (!) 101.3 F (38.5 C)     Temp Source 01/01/24 1440 Oral     SpO2 01/01/24 1440 98 %     Weight --      Height --      Head Circumference --      Peak Flow --      Pain Score 01/01/24 1441 0  Pain Loc --      Pain Education --      Exclude from Growth Chart --    No data found.  Updated Vital Signs Pulse 72   Temp (!) 101.3 F (38.5 C) (Oral)   Resp 16   SpO2 98%   Visual Acuity Right Eye Distance:   Left Eye Distance:   Bilateral Distance:    Right Eye Near:   Left Eye Near:    Bilateral Near:     Physical Exam Vitals and nursing note reviewed.  Constitutional:      Appearance: He is normal weight. He is ill-appearing.  HENT:     Head: Normocephalic.     Jaw: Tenderness present. No swelling.     Comments: Tenderness along right jaw    Mouth/Throat:     Mouth: Mucous membranes are moist.     Dentition: Dental tenderness and dental caries present.   Cardiovascular:     Rate and Rhythm: Normal rate and regular rhythm.  Pulmonary:     Effort: Pulmonary effort is normal.     Breath sounds: Normal breath sounds.  Abdominal:     General: Abdomen is flat.     Palpations: Abdomen is soft.  Musculoskeletal:        General: Normal range of motion.  Skin:     Capillary Refill: Capillary refill takes less than 2 seconds.  Neurological:     General: No focal deficit present.     Mental Status: He is alert.      UC Treatments / Results  Labs (all labs ordered are listed, but only abnormal results are displayed) Labs Reviewed  POC COVID19/FLU A&B COMBO  POCT RAPID STREP A (OFFICE)    EKG   Radiology No results found.  Procedures Procedures (including critical care time)  Medications Ordered in UC Medications  ibuprofen  (ADVIL ) tablet 800 mg (800 mg Oral Given 01/01/24 1506)    Initial Impression / Assessment and Plan / UC Course  I have reviewed the triage vital signs and the nursing notes.  Pertinent labs & imaging results that were available during my care of the patient were reviewed by me and considered in my medical decision making (see chart for details).   Viral and strep testing negative. Exam concerning for dental abscess. Ibuprofen  given in clinic for fever given high tylenol  usage over the last week. Patient has follow up with dentist in process. Recommend calling again on Monday to schedule urgent follow up appointment. Abx sent to pharmacy and return precautions given.  If fever does not resolve with tylenol  and ibuprofen  he is in agreement to go to the emergency department.     Final Clinical Impressions(s) / UC Diagnoses   Final diagnoses:  Dental abscess     Discharge Instructions      Please follow up with dentist on Monday.  Your dental pain is likely due to dental infection. Take  antibiotic as prescribed for the next 7 days to treat your dental infection. Continue use of over the counter medications as needed with food for dental inflammation and pain like tylenol  (up to 3000mg  in 24 hours) and ibuprofen  (up to 1200mg ).  Perform salt water gargles every 3-4 hours.   You may return to work once you do not have a fever.   If you develop any new or worsening symptoms or if your symptoms do not start  to improve, pleases return here or follow-up with your primary care provider. If your  symptoms are severe, please go to the emergency room.     ED Prescriptions     Medication Sig Dispense Auth. Provider   amoxicillin -clavulanate (AUGMENTIN ) 875-125 MG tablet Take 1 tablet by mouth every 12 (twelve) hours for 7 days. 14 tablet Remi Pippin, NP      PDMP not reviewed this encounter.     [1]  Social History Tobacco Use   Smoking status: Every Day    Current packs/day: 0.50    Average packs/day: 0.5 packs/day for 13.0 years (6.5 ttl pk-yrs)    Types: Cigarettes   Smokeless tobacco: Never  Vaping Use   Vaping status: Never Used  Substance Use Topics   Alcohol use: Yes    Comment: very rare   Drug use: Yes    Types: Marijuana     Remi Pippin, NP 01/01/24 1526  "
